# Patient Record
Sex: Female | Born: 1980 | Race: White | Hispanic: No | Smoking: Current every day smoker
Health system: Southern US, Community
[De-identification: ages and names within clinical notes are randomized; demographics above are authoritative.]

## PROBLEM LIST (undated history)

## (undated) DIAGNOSIS — F329 Major depressive disorder, single episode, unspecified: Secondary | ICD-10-CM

## (undated) DIAGNOSIS — G8929 Other chronic pain: Secondary | ICD-10-CM

## (undated) DIAGNOSIS — F32A Depression, unspecified: Secondary | ICD-10-CM

## (undated) DIAGNOSIS — N83209 Unspecified ovarian cyst, unspecified side: Secondary | ICD-10-CM

## (undated) DIAGNOSIS — R102 Pelvic and perineal pain: Secondary | ICD-10-CM

## (undated) HISTORY — PX: TUBAL LIGATION: SHX77

---

## 2011-04-08 ENCOUNTER — Encounter (HOSPITAL_COMMUNITY): Payer: Self-pay | Admitting: *Deleted

## 2011-04-08 ENCOUNTER — Inpatient Hospital Stay (HOSPITAL_COMMUNITY): Payer: Self-pay

## 2011-04-08 ENCOUNTER — Inpatient Hospital Stay (HOSPITAL_COMMUNITY)
Admission: AD | Admit: 2011-04-08 | Discharge: 2011-04-08 | Disposition: A | Discharge status: Home / Self Care | Payer: Self-pay | Source: Ambulatory Visit | Attending: Family Medicine | Admitting: Family Medicine

## 2011-04-08 DIAGNOSIS — N949 Unspecified condition associated with female genital organs and menstrual cycle: Secondary | ICD-10-CM | POA: Insufficient documentation

## 2011-04-08 DIAGNOSIS — R102 Pelvic and perineal pain: Secondary | ICD-10-CM

## 2011-04-08 DIAGNOSIS — A499 Bacterial infection, unspecified: Secondary | ICD-10-CM | POA: Insufficient documentation

## 2011-04-08 DIAGNOSIS — R109 Unspecified abdominal pain: Secondary | ICD-10-CM | POA: Insufficient documentation

## 2011-04-08 DIAGNOSIS — N760 Acute vaginitis: Secondary | ICD-10-CM | POA: Insufficient documentation

## 2011-04-08 DIAGNOSIS — B9689 Other specified bacterial agents as the cause of diseases classified elsewhere: Secondary | ICD-10-CM | POA: Insufficient documentation

## 2011-04-08 HISTORY — DX: Pelvic and perineal pain: R10.2

## 2011-04-08 HISTORY — DX: Unspecified ovarian cyst, unspecified side: N83.209

## 2011-04-08 HISTORY — DX: Other chronic pain: G89.29

## 2011-04-08 LAB — URINALYSIS, ROUTINE W REFLEX MICROSCOPIC
Bilirubin Urine: NEGATIVE
Hgb urine dipstick: NEGATIVE
Ketones, ur: NEGATIVE mg/dL
Specific Gravity, Urine: 1.025 (ref 1.005–1.030)
pH: 6.5 (ref 5.0–8.0)

## 2011-04-08 LAB — DIFFERENTIAL
Eosinophils Absolute: 0.3 10*3/uL (ref 0.0–0.7)
Eosinophils Relative: 3 % (ref 0–5)
Lymphocytes Relative: 27 % (ref 12–46)
Lymphs Abs: 3.2 10*3/uL (ref 0.7–4.0)
Monocytes Absolute: 0.9 10*3/uL (ref 0.1–1.0)
Monocytes Relative: 8 % (ref 3–12)

## 2011-04-08 LAB — CBC
HCT: 43.4 % (ref 36.0–46.0)
MCH: 29.8 pg (ref 26.0–34.0)
MCV: 90.4 fL (ref 78.0–100.0)
RBC: 4.8 MIL/uL (ref 3.87–5.11)
WBC: 11.6 10*3/uL — ABNORMAL HIGH (ref 4.0–10.5)

## 2011-04-08 LAB — WET PREP, GENITAL: Yeast Wet Prep HPF POC: NONE SEEN

## 2011-04-08 MED ORDER — METRONIDAZOLE 500 MG PO TABS: 500.0000 mg | ORAL_TABLET | Freq: Two times a day (BID) | ORAL | Status: AC

## 2011-04-08 NOTE — Progress Notes (Signed)
Lower abdominal pain x 2 years with vaginal odor, was told was BV, history of ovarian cysts.

## 2011-04-08 NOTE — ED Provider Notes (Signed)
History     Chief Complaint  Patient presents with  . Abdominal Pain  . Vaginal Discharge   HPIAdrian Ranson30 y.o. presents with ovarian pain that began this am.  States urinating first thing every am is painful but today it continues.  Reports frequency without hematuria.  Ovarian pain off and on for 2 years.  Has been seen by MD in [place], dx with BV and treated 6 months ago.  Reports foul odor after intercourse "all the time".  G5 P2 1 2 3.  LMP 10 days ago.  BTL.      Past Medical History  Diagnosis Date  . Ovarian cyst   . Chronic pelvic pain in female     Past Surgical History  Procedure Date  . Cesarean section   . Tubal ligation     Family History  Problem Relation Age of Onset  . Cancer Mother   . Kidney disease Mother   . Hypertension Father   . Cancer Maternal Aunt   . Cancer Maternal Grandmother   . Arthritis Paternal Grandfather     History  Substance Use Topics  . Smoking status: Current Everyday Smoker -- 1.5 packs/day for 17 years    Types: Cigarettes  . Smokeless tobacco: Not on file  . Alcohol Use: Yes     occasionally a couple days a week    Allergies: Allergies no known allergies  Prescriptions prior to admission  Medication Sig Dispense Refill  . ferrous fumarate (HEMOCYTE - 106 MG FE) 325 (106 FE) MG TABS Take 1 tablet by mouth daily.        Marland Kitchen HYDROcodone-acetaminophen (NORCO) 10-325 MG per tablet Take 2 tablets by mouth every 6 (six) hours as needed. For pain      . vitamin B-12 (CYANOCOBALAMIN) 500 MCG tablet Take 500 mcg by mouth daily.          Review of Systems  Gastrointestinal: Positive for abdominal pain.  Genitourinary: Positive for dysuria (vaginal odor) and frequency.   Physical Exam   Blood pressure 102/56, pulse 69, temperature 98 F (36.7 C), temperature source Oral, resp. rate 16, height 5\' 10"  (1.778 m), weight 136 lb (61.689 kg), last menstrual period 03/30/2011.  Physical Exam  Constitutional: She is oriented  to person, place, and time. She appears well-developed and well-nourished. No distress.  HENT:  Head: Normocephalic.  Neck: Neck supple.  Respiratory: Effort normal.  GI: Soft. There is no rebound and no guarding.  Genitourinary: Uterus is not enlarged and not tender. Cervix exhibits no motion tenderness, no discharge and no friability. Right adnexum displays tenderness (mild tenderness). Right adnexum displays no mass and no fullness. Left adnexum displays no mass, no tenderness and no fullness. No erythema or bleeding around the vagina. Vaginal discharge (malodorous) found.  Neurological: She is alert and oriented to person, place, and time.  Skin: Skin is warm and dry.    TRANSABDOMINAL AND TRANSVAGINAL ULTRASOUND OF PELVIS  Technique: Both transabdominal and transvaginal ultrasound  examinations of the pelvis were performed. Transabdominal technique  was performed for global imaging of the pelvis including uterus,  ovaries, adnexal regions, and pelvic cul-de-sac.  Comparison: None.  It was necessary to proceed with endovaginal exam following the  transabdominal exam to visualize the uterus and adnexa.  Findings:  Uterus: 10 cm x 4.7 cm x 6.5 cm with normal myometrial  echotexture.  Endometrium: 12 mm, normal.  Right ovary: 35 mm x 35 mm x 27 mm with a dominant follicle  measuring 26 mm.  Left ovary: 26 mm x 18 mm x 25 mm.  Other Findings: No free fluid.  IMPRESSION:  Normal study. No evidence of pelvic mass or other significant  abnormality.  Original Report Authenticated By: Andreas Newport, M.D.      Imaging      MAU Course  Procedures  Gc/Chl culture to lab    Assessment and Plan  A:  Bacterial vaginosis      Pelvic pain  P Rx written for Flagyl  Told her we would call her if her cultures are positive. Discussed at length the lab and ultrasound results with the patient.  Encouraged condom use to decrease risk of re-infection.  If sxs continue, patient agrees to  see her doctor in [place] for followup OR make an appt with a doctor in [place] 04/08/2011, 6:04 PM   Matt Holmes, NP 04/08/11 1926

## 2011-04-08 NOTE — Progress Notes (Signed)
Pt c/o ovarian pain and a foul smell that occurs at least weekly for the past two years.

## 2011-04-09 LAB — GC/CHLAMYDIA PROBE AMP, GENITAL
Chlamydia, DNA Probe: NEGATIVE
GC Probe Amp, Genital: NEGATIVE

## 2011-04-09 NOTE — ED Provider Notes (Signed)
Chart reviewed and agree with management and plan.  

## 2012-05-14 ENCOUNTER — Encounter (HOSPITAL_COMMUNITY): Payer: Self-pay | Admitting: Anesthesiology

## 2012-05-14 ENCOUNTER — Emergency Department (HOSPITAL_COMMUNITY)
Admission: EM | Admit: 2012-05-14 | Discharge: 2012-05-14 | Disposition: A | Discharge status: Home / Self Care | Payer: Self-pay | Attending: Emergency Medicine | Admitting: Emergency Medicine

## 2012-05-14 ENCOUNTER — Emergency Department (HOSPITAL_COMMUNITY): Payer: Self-pay | Admitting: Anesthesiology

## 2012-05-14 ENCOUNTER — Encounter (HOSPITAL_COMMUNITY): Payer: Self-pay | Admitting: Emergency Medicine

## 2012-05-14 ENCOUNTER — Encounter (HOSPITAL_COMMUNITY)
Admission: EM | Disposition: A | Discharge status: Home / Self Care | Payer: Self-pay | Source: Home / Self Care | Attending: Emergency Medicine

## 2012-05-14 ENCOUNTER — Other Ambulatory Visit: Payer: Self-pay | Admitting: Orthopedic Surgery

## 2012-05-14 DIAGNOSIS — W260XXA Contact with knife, initial encounter: Secondary | ICD-10-CM | POA: Insufficient documentation

## 2012-05-14 DIAGNOSIS — S61409A Unspecified open wound of unspecified hand, initial encounter: Secondary | ICD-10-CM | POA: Insufficient documentation

## 2012-05-14 DIAGNOSIS — S66829A Laceration of other specified muscles, fascia and tendons at wrist and hand level, unspecified hand, initial encounter: Secondary | ICD-10-CM | POA: Diagnosis present

## 2012-05-14 DIAGNOSIS — Y93G1 Activity, food preparation and clean up: Secondary | ICD-10-CM | POA: Insufficient documentation

## 2012-05-14 DIAGNOSIS — S66909A Unspecified injury of unspecified muscle, fascia and tendon at wrist and hand level, unspecified hand, initial encounter: Secondary | ICD-10-CM | POA: Insufficient documentation

## 2012-05-14 DIAGNOSIS — Y92009 Unspecified place in unspecified non-institutional (private) residence as the place of occurrence of the external cause: Secondary | ICD-10-CM | POA: Insufficient documentation

## 2012-05-14 DIAGNOSIS — F17200 Nicotine dependence, unspecified, uncomplicated: Secondary | ICD-10-CM | POA: Insufficient documentation

## 2012-05-14 DIAGNOSIS — IMO0002 Reserved for concepts with insufficient information to code with codable children: Secondary | ICD-10-CM | POA: Insufficient documentation

## 2012-05-14 SURGERY — NERVE, TENDON AND ARTERY REPAIR
Anesthesia: General | Site: Hand | Laterality: Left | Wound class: Clean

## 2012-05-14 MED ORDER — BUPIVACAINE HCL (PF) 0.25 % IJ SOLN
INTRAMUSCULAR | Status: AC
  Filled 2012-05-14: qty 30

## 2012-05-14 MED ORDER — HYDROMORPHONE HCL PF 1 MG/ML IJ SOLN
1.0000 mg | Freq: Once | INTRAMUSCULAR | Status: AC
  Administered 2012-05-14: 1 mg via INTRAVENOUS
  Filled 2012-05-14: qty 1

## 2012-05-14 MED ORDER — BUPIVACAINE HCL (PF) 0.25 % IJ SOLN
INTRAMUSCULAR | Status: DC | PRN
  Administered 2012-05-14: 10 mL

## 2012-05-14 MED ORDER — OXYCODONE-ACETAMINOPHEN 5-325 MG PO TABS: 1.0000 | ORAL_TABLET | ORAL | Status: DC | PRN

## 2012-05-14 MED ORDER — FENTANYL CITRATE 0.05 MG/ML IJ SOLN
50.0000 ug | Freq: Once | INTRAMUSCULAR | Status: AC
  Administered 2012-05-14: 50 ug via INTRAVENOUS

## 2012-05-14 MED ORDER — MIDAZOLAM HCL 5 MG/5ML IJ SOLN
INTRAMUSCULAR | Status: DC | PRN
  Administered 2012-05-14: 1 mg via INTRAVENOUS

## 2012-05-14 MED ORDER — OXYCODONE-ACETAMINOPHEN 5-325 MG PO TABS
1.0000 | ORAL_TABLET | ORAL | Status: DC | PRN
  Administered 2012-05-14: 1 via ORAL

## 2012-05-14 MED ORDER — PROMETHAZINE HCL 25 MG/ML IJ SOLN
6.2500 mg | INTRAMUSCULAR | Status: DC | PRN
  Administered 2012-05-14: 12.5 mg via INTRAVENOUS

## 2012-05-14 MED ORDER — OXYCODONE-ACETAMINOPHEN 5-325 MG PO TABS
ORAL_TABLET | ORAL | Status: AC
  Filled 2012-05-14: qty 1

## 2012-05-14 MED ORDER — LACTATED RINGERS IV SOLN: INTRAVENOUS | Status: DC

## 2012-05-14 MED ORDER — LIDOCAINE HCL (CARDIAC) 20 MG/ML IV SOLN
INTRAVENOUS | Status: DC | PRN
  Administered 2012-05-14: 50 mg via INTRAVENOUS

## 2012-05-14 MED ORDER — ONDANSETRON HCL 4 MG/2ML IJ SOLN
4.0000 mg | Freq: Once | INTRAMUSCULAR | Status: AC
  Administered 2012-05-14: 4 mg via INTRAVENOUS
  Filled 2012-05-14: qty 2

## 2012-05-14 MED ORDER — MEPERIDINE HCL 25 MG/ML IJ SOLN: 6.2500 mg | INTRAMUSCULAR | Status: DC | PRN

## 2012-05-14 MED ORDER — HYDROMORPHONE HCL PF 1 MG/ML IJ SOLN
INTRAMUSCULAR | Status: AC
  Filled 2012-05-14: qty 1

## 2012-05-14 MED ORDER — FENTANYL CITRATE 0.05 MG/ML IJ SOLN
50.0000 ug | Freq: Once | INTRAMUSCULAR | Status: AC
  Administered 2012-05-14: 50 ug via INTRAVENOUS
  Filled 2012-05-14: qty 2

## 2012-05-14 MED ORDER — CHLORHEXIDINE GLUCONATE 4 % EX LIQD
60.0000 mL | Freq: Once | CUTANEOUS | Status: DC
  Filled 2012-05-14: qty 60

## 2012-05-14 MED ORDER — FENTANYL CITRATE 0.05 MG/ML IJ SOLN
INTRAMUSCULAR | Status: DC | PRN
  Administered 2012-05-14: 50 ug via INTRAVENOUS
  Administered 2012-05-14: 25 ug via INTRAVENOUS
  Administered 2012-05-14 (×2): 50 ug via INTRAVENOUS
  Administered 2012-05-14: 25 ug via INTRAVENOUS
  Administered 2012-05-14: 100 ug via INTRAVENOUS

## 2012-05-14 MED ORDER — HYDROMORPHONE HCL PF 1 MG/ML IJ SOLN
0.2500 mg | INTRAMUSCULAR | Status: DC | PRN
  Administered 2012-05-14 (×6): 0.5 mg via INTRAVENOUS

## 2012-05-14 MED ORDER — CEFAZOLIN SODIUM 1-5 GM-% IV SOLN
INTRAVENOUS | Status: DC | PRN
  Administered 2012-05-14: 1 g via INTRAVENOUS

## 2012-05-14 MED ORDER — PROMETHAZINE HCL 25 MG/ML IJ SOLN
INTRAMUSCULAR | Status: AC
  Filled 2012-05-14: qty 1

## 2012-05-14 MED ORDER — LACTATED RINGERS IV SOLN
INTRAVENOUS | Status: DC | PRN
  Administered 2012-05-14 (×2): via INTRAVENOUS

## 2012-05-14 MED ORDER — LACTATED RINGERS IV SOLN
INTRAVENOUS | Status: DC
  Administered 2012-05-14: 13:00:00 via INTRAVENOUS

## 2012-05-14 MED ORDER — CEFAZOLIN SODIUM 1-5 GM-% IV SOLN
INTRAVENOUS | Status: AC
  Filled 2012-05-14: qty 50

## 2012-05-14 MED ORDER — PROPOFOL 10 MG/ML IV BOLUS
INTRAVENOUS | Status: DC | PRN
  Administered 2012-05-14: 200 mg via INTRAVENOUS

## 2012-05-14 MED ORDER — ONDANSETRON HCL 4 MG/2ML IJ SOLN
INTRAMUSCULAR | Status: DC | PRN
  Administered 2012-05-14: 4 mg via INTRAVENOUS

## 2012-05-14 SURGICAL SUPPLY — 54 items
BANDAGE ELASTIC 4 VELCRO ST LF (GAUZE/BANDAGES/DRESSINGS) IMPLANT
BANDAGE GAUZE ELAST BULKY 4 IN (GAUZE/BANDAGES/DRESSINGS) ×2 IMPLANT
BNDG CMPR 9X4 STRL LF SNTH (GAUZE/BANDAGES/DRESSINGS) ×1
BNDG ESMARK 4X9 LF (GAUZE/BANDAGES/DRESSINGS) ×2 IMPLANT
CLOTH BEACON ORANGE TIMEOUT ST (SAFETY) ×2 IMPLANT
CORDS BIPOLAR (ELECTRODE) ×2 IMPLANT
COVER SURGICAL LIGHT HANDLE (MISCELLANEOUS) ×2 IMPLANT
CUFF TOURNIQUET SINGLE 18IN (TOURNIQUET CUFF) IMPLANT
CUFF TOURNIQUET SINGLE 24IN (TOURNIQUET CUFF) IMPLANT
DECANTER SPIKE VIAL GLASS SM (MISCELLANEOUS) IMPLANT
DRAPE OEC MINIVIEW 54X84 (DRAPES) IMPLANT
DRAPE SURG 17X23 STRL (DRAPES) ×2 IMPLANT
DURAPREP 26ML APPLICATOR (WOUND CARE) ×2 IMPLANT
GAUZE XEROFORM 1X8 LF (GAUZE/BANDAGES/DRESSINGS) ×2 IMPLANT
GLOVE BIO SURGEON STRL SZ8.5 (GLOVE) ×2 IMPLANT
GLOVE BIOGEL PI IND STRL 8 (GLOVE) ×1 IMPLANT
GLOVE BIOGEL PI INDICATOR 8 (GLOVE) ×1
GOWN PREVENTION PLUS XLARGE (GOWN DISPOSABLE) ×2 IMPLANT
GOWN STRL NON-REIN LRG LVL3 (GOWN DISPOSABLE) ×2 IMPLANT
KIT BASIN OR (CUSTOM PROCEDURE TRAY) ×2 IMPLANT
KIT ROOM TURNOVER OR (KITS) ×2 IMPLANT
LOOP VESSEL MAXI BLUE (MISCELLANEOUS) IMPLANT
MANIFOLD NEPTUNE II (INSTRUMENTS) ×2 IMPLANT
NEEDLE HYPO 25GX1X1/2 BEV (NEEDLE) ×2 IMPLANT
NS IRRIG 1000ML POUR BTL (IV SOLUTION) ×2 IMPLANT
PACK ORTHO EXTREMITY (CUSTOM PROCEDURE TRAY) ×2 IMPLANT
PAD ARMBOARD 7.5X6 YLW CONV (MISCELLANEOUS) ×4 IMPLANT
PAD CAST 4YDX4 CTTN HI CHSV (CAST SUPPLIES) IMPLANT
PADDING CAST ABS 4INX4YD NS (CAST SUPPLIES) ×1
PADDING CAST ABS COTTON 4X4 ST (CAST SUPPLIES) ×1 IMPLANT
PADDING CAST COTTON 4X4 STRL (CAST SUPPLIES)
SPEAR EYE SURG WECK-CEL (MISCELLANEOUS) IMPLANT
SPECIMEN JAR SMALL (MISCELLANEOUS) ×2 IMPLANT
SPONGE GAUZE 4X4 12PLY (GAUZE/BANDAGES/DRESSINGS) ×2 IMPLANT
STRIP CLOSURE SKIN 1/2X4 (GAUZE/BANDAGES/DRESSINGS) IMPLANT
SUCTION FRAZIER TIP 10 FR DISP (SUCTIONS) IMPLANT
SUT ETHIBOND 3-0 V-5 (SUTURE) ×4 IMPLANT
SUT ETHILON 4 0 PS 2 18 (SUTURE) ×2 IMPLANT
SUT ETHILON 5 0 PS 2 18 (SUTURE) IMPLANT
SUT ETHILON 9 0 BV130 4 (SUTURE) ×4 IMPLANT
SUT MERSILENE 4 0 P 3 (SUTURE) IMPLANT
SUT PROLENE 3 0 PS 2 (SUTURE) IMPLANT
SUT PROLENE 6 0 PC 1 (SUTURE) ×2 IMPLANT
SUT SILK 4 0 PS 2 (SUTURE) IMPLANT
SUT VIC AB 2-0 SH 27 (SUTURE) ×2
SUT VIC AB 2-0 SH 27XBRD (SUTURE) ×1 IMPLANT
SUT VIC AB 3-0 FS2 27 (SUTURE) IMPLANT
SUT VICRYL RAPIDE 4/0 PS 2 (SUTURE) ×4 IMPLANT
SYR CONTROL 10ML LL (SYRINGE) IMPLANT
TOWEL OR 17X24 6PK STRL BLUE (TOWEL DISPOSABLE) ×2 IMPLANT
TOWEL OR 17X26 10 PK STRL BLUE (TOWEL DISPOSABLE) ×2 IMPLANT
TUBE CONNECTING 12X1/4 (SUCTIONS) IMPLANT
UNDERPAD 30X30 INCONTINENT (UNDERPADS AND DIAPERS) ×2 IMPLANT
WATER STERILE IRR 1000ML POUR (IV SOLUTION) ×2 IMPLANT

## 2012-05-14 NOTE — ED Notes (Signed)
Or is ready for pt. 

## 2012-05-14 NOTE — Consult Note (Signed)
Reason for Consult:left thumb lacdration Referring Physician: [name] is an 31 y.o. female.  HPI: s/p deep lac to left thumb with probable T/A/N  Past Medical History  Diagnosis Date  . Ovarian cyst   . Chronic pelvic pain in female     Past Surgical History  Procedure Date  . Cesarean section   . Tubal ligation     Family History  Problem Relation Age of Onset  . Cancer Mother   . Kidney disease Mother   . Hypertension Father   . Cancer Maternal Aunt   . Cancer Maternal Grandmother   . Arthritis Paternal Grandfather     Social History:  reports that she has been smoking Cigarettes.  She has a 25.5 pack-year smoking history. She does not have any smokeless tobacco history on file. She reports that she drinks alcohol. She reports that she does not use illicit drugs.  Allergies: No Known Allergies  Medications: Prior to Admission:  (Not in a hospital admission)  No results found for this or any previous visit (from the past 48 hour(s)).  No results found.  Review of Systems  All other systems reviewed and are negative.   Blood pressure 114/74, pulse 80, temperature 98.1 F (36.7 C), temperature source Oral, resp. rate 18, SpO2 98.00%. Physical Exam  Constitutional: She is oriented to person, place, and time. She appears well-developed and well-nourished.  HENT:  Head: Normocephalic and atraumatic.  Cardiovascular: Normal rate.   Respiratory: Effort normal.  Musculoskeletal:       Hands: Neurological: She is alert and oriented to person, place, and time.  Skin: Skin is warm.  Psychiatric: She has a normal mood and affect. Her behavior is normal. Judgment and thought content normal.    Assessment/Plan: As above   Plan explore and repair as needed  Evelyn Richardson A 05/14/2012, 1:16 PM

## 2012-05-14 NOTE — ED Provider Notes (Signed)
12:14 PM Assumed care of the patient.  She is awaiting surgery of her hand.  Patient is complaining of sig pain. Nursing staff states that she is threatening to leave if pain is not controlled.   BP is stable and I am giving patient more dilaudid.   CV: RRR, No M/R/G, Peripheral pulses intact. No peripheral edema. Lungs: CTAB Abd: Soft, Non tender, non distended Large bandage covering laceration of left hand.  Surgery has already been consulted by PA Albert. Dr. Orlan Leavens will consult and admit.   Arthor Captain, PA-C 05/15/12 1321

## 2012-05-14 NOTE — ED Provider Notes (Signed)
History     CSN: [id number]  Arrival date & time 05/14/12  [id number]   First MD Initiated Contact with Patient 05/14/12 [phone]      Chief Complaint  Patient presents with  . Laceration    (Consider location/radiation/quality/duration/timing/severity/associated sxs/prior treatment) HPI Comments: 31 y/o female presents the emergency department with a laceration below her left thumb after trying to cut an apple with a dull knife earlier this morning. She is complaining of a great amount of pain and states she is bleeding a lot. Denies lightheadedness or dizziness. States her thumb is beginning to get numb. Last tetanus shot was 2 years ago. She's been applying pressure to the area to try to relieve the bleeding.  The history is provided by the patient.    Past Medical History  Diagnosis Date  . Ovarian cyst   . Chronic pelvic pain in female     Past Surgical History  Procedure Date  . Cesarean section   . Tubal ligation     Family History  Problem Relation Age of Onset  . Cancer Mother   . Kidney disease Mother   . Hypertension Father   . Cancer Maternal Aunt   . Cancer Maternal Grandmother   . Arthritis Paternal Grandfather     History  Substance Use Topics  . Smoking status: Current Every Day Smoker -- 1.5 packs/day for 17 years    Types: Cigarettes  . Smokeless tobacco: Not on file  . Alcohol Use: Yes     occasionally a couple days a week    OB History    Grav Para Term Preterm Abortions TAB SAB Ect Mult Living   5 3 2 1 2 2    3       Review of Systems  Musculoskeletal:       Left hand pain  Skin: Positive for wound.  Neurological: Positive for numbness. Negative for light-headedness.  Psychiatric/Behavioral: The patient is nervous/anxious.     Allergies  Review of patient's allergies indicates no known allergies.  Home Medications  No current outpatient prescriptions on file.  BP 153/88  Pulse 102  Temp 98.1 F (36.7 C) (Oral)  Resp 22  SpO2  98%  Physical Exam  Constitutional: She is oriented to person, place, and time. She appears well-developed and well-nourished. No distress.  HENT:  Head: Normocephalic and atraumatic.  Eyes: Conjunctivae normal and EOM are normal.  Neck: Normal range of motion.  Cardiovascular: Regular rhythm and intact distal pulses.  Tachycardia present.   Pulmonary/Chest: Effort normal and breath sounds normal. No respiratory distress.  Musculoskeletal:       Left hand: She exhibits decreased range of motion (disruption of flexor tendon of thumb) and laceration. She exhibits normal capillary refill.  Neurological: She is alert and oriented to person, place, and time.  Skin: Laceration (1 inch over left thenar eminence) noted.  Psychiatric: Her mood appears anxious. Her speech is rapid and/or pressured. She is is hyperactive.    ED Course  Procedures (including critical care time)  Labs Reviewed - No data to display No results found.   1. Flexor tendon laceration of hand with open wound       MDM  31 y/o female with flexor tendon laceration of left hand. Hand surgeon consulted and patient will be having surgery at 2:30 today. She will be moved to the CDU and monitored by Arthor Captain, PA-C until that occurs.         Trevor Mace, PA-C  05/15/12 0607 

## 2012-05-14 NOTE — Preoperative (Signed)
Beta Blockers   Reason not to administer Beta Blockers:Not Applicable 

## 2012-05-14 NOTE — Anesthesia Procedure Notes (Signed)
Procedure Name: LMA Insertion Date/Time: 05/14/2012 1:41 PM Performed by: Carmela Rima Pre-anesthesia Checklist: Patient identified, Timeout performed, Emergency Drugs available, Suction available and Patient being monitored Patient Re-evaluated:Patient Re-evaluated prior to inductionOxygen Delivery Method: Circle system utilized Preoxygenation: Pre-oxygenation with 100% oxygen Intubation Type: IV induction Ventilation: Mask ventilation without difficulty LMA: LMA inserted LMA Size: 4.0 Number of attempts: 1 Placement Confirmation: positive ETCO2 and breath sounds checked- equal and bilateral Tube secured with: Tape Dental Injury: Teeth and Oropharynx as per pre-operative assessment

## 2012-05-14 NOTE — Op Note (Signed)
See dictated note [phone]

## 2012-05-14 NOTE — Anesthesia Preprocedure Evaluation (Addendum)
Anesthesia Evaluation  Patient identified by MRN, date of birth, ID band Patient awake    Reviewed: Allergy & Precautions, H&P , NPO status , Patient's Chart, lab work & pertinent test results  Airway Mallampati: II TM Distance: >3 FB Neck ROM: Full    Dental No notable dental hx.    Pulmonary neg pulmonary ROS, Current Smoker,  breath sounds clear to auscultation  Pulmonary exam normal       Cardiovascular negative cardio ROS  Rhythm:Regular Rate:Normal     Neuro/Psych negative neurological ROS  negative psych ROS   GI/Hepatic negative GI ROS, Neg liver ROS,   Endo/Other  negative endocrine ROS  Renal/GU negative Renal ROS  negative genitourinary   Musculoskeletal negative musculoskeletal ROS (+)   Abdominal   Peds negative pediatric ROS (+)  Hematology negative hematology ROS (+)   Anesthesia Other Findings   Reproductive/Obstetrics negative OB ROS                          Anesthesia Physical Anesthesia Plan  ASA: II  Anesthesia Plan: General   Post-op Pain Management:    Induction: Intravenous  Airway Management Planned: LMA  Additional Equipment:   Intra-op Plan:   Post-operative Plan: Extubation in OR  Informed Consent: I have reviewed the patients History and Physical, chart, labs and discussed the procedure including the risks, benefits and alternatives for the proposed anesthesia with the patient or authorized representative who has indicated his/her understanding and acceptance.   Dental advisory given and Dental Advisory Given  Plan Discussed with: CRNA, Anesthesiologist and Surgeon  Anesthesia Plan Comments:        Anesthesia Quick Evaluation

## 2012-05-14 NOTE — Transfer of Care (Signed)
Immediate Anesthesia Transfer of Care Note  Patient: Evelyn Richardson  Procedure(s) Performed: Procedure(s) (LRB) with comments: NERVE, TENDON AND ARTERY REPAIR (Left)  Patient Location: PACU  Anesthesia Type: General  Level of Consciousness: awake, alert  and oriented  Airway & Oxygen Therapy: Patient Spontanous Breathing and Patient connected to nasal cannula oxygen  Post-op Assessment: Report given to PACU RN, Post -op Vital signs reviewed and stable and Patient moving all extremities  Post vital signs: Reviewed and stable  Complications: No apparent anesthesia complications

## 2012-05-14 NOTE — Progress Notes (Signed)
Call to dr. Mina Marble because pt wanted stronger medicine as a home presription.  He denied the request.  Also, spoke with dr. Randa Evens who gave permission for one roxicet 5/325. Pt had had 3 mg dilauded at this point and still complained of 10/10 pain.  Dr. Randa Evens aware and no further orders given.  Pt discharged and encouraged to fill prescription promply and take as prescribed.

## 2012-05-14 NOTE — ED Notes (Signed)
Patient is extremely upset and threating to go AMA. Pain medication was given and dressing was redone for the bleeding. Patient is now calm and being moved to CDU.

## 2012-05-14 NOTE — Brief Op Note (Signed)
05/14/2012  3:08 PM  PATIENT:  Evelyn Richardson  31 y.o. female  PRE-OPERATIVE DIAGNOSIS:  left thumb laceration  POST-OPERATIVE DIAGNOSIS:  left thumb laceration  PROCEDURE:  Procedure(s) (LRB) with comments: NERVE, TENDON AND ARTERY REPAIR (Left)  SURGEON:  Surgeon(s) and Role:    * Marlowe Shores, MD - Primary  PHYSICIAN ASSISTANT:   ASSISTANTS: none   ANESTHESIA:   general  EBL:  Total I/O In: 1200 [I.V.:1200] Out: 10 [Blood:10]  BLOOD ADMINISTERED:none  DRAINS: none   LOCAL MEDICATIONS USED:  MARCAINE   10cc  SPECIMEN:  No Specimen  DISPOSITION OF SPECIMEN:  N/A  COUNTS:  YES  TOURNIQUET:   Total Tourniquet Time Documented: Upper Arm (Left) - 71 minutes  DICTATION: .Other Dictation: Dictation Number [id number]  PLAN OF CARE: Discharge to home after PACU  PATIENT DISPOSITION:  PACU - hemodynamically stable.   Delay start of Pharmacological VTE agent (>24hrs) due to surgical blood loss or risk of bleeding: not applicable

## 2012-05-14 NOTE — ED Notes (Signed)
Report called to zach in anesthesia

## 2012-05-14 NOTE — Anesthesia Postprocedure Evaluation (Signed)
  Anesthesia Post-op Note  Patient: Evelyn Richardson  Procedure(s) Performed: Procedure(s) (LRB): NERVE, TENDON AND ARTERY REPAIR (Left)  Patient Location: PACU  Anesthesia Type: General  Level of Consciousness: awake and alert   Airway and Oxygen Therapy: Patient Spontanous Breathing  Post-op Pain: mild  Post-op Assessment: Post-op Vital signs reviewed, Patient's Cardiovascular Status Stable, Respiratory Function Stable, Patent Airway and No signs of Nausea or vomiting  Post-op Vital Signs: stable  Complications: No apparent anesthesia complications

## 2012-05-14 NOTE — ED Provider Notes (Signed)
Patient lacerated left thenar eminence one hour ago while slicing an apple with a knife.. She complains of pain at left thenar eminence. On exam patient is alert nontoxic left hand with laceration at left thenar eminence she is unable to flex or oppose from. She is able to extend the thumb with pain. No active bleeding. Good capillary refill Suspect tendon injury Hand surgery call to evaluate patient  Doug Sou, MD 05/14/12 [phone]

## 2012-05-14 NOTE — ED Notes (Addendum)
Pt presents with 1-2 inch lac to left thumb, bleeding controlled on arrival, no other injuries, sts last tetnus 2 years ago, NAD

## 2012-05-15 NOTE — Op Note (Signed)
[name], TAPE               ACCOUNT NO.:  [id number]  MEDICAL RECORD NO.:  [id number]  LOCATION:  [facility]                         FACILITY:  [facility]  PHYSICIAN:  Artist Pais. Tomi Paddock, M.D.DATE OF BIRTH:  1980-11-28  DATE OF PROCEDURE:  05/14/2012 DATE OF DISCHARGE:  05/14/2012                              OPERATIVE REPORT   PREOPERATIVE DIAGNOSIS:  Deep laceration, palmar aspect and thenar area, left thumb.  POSTOPERATIVE DIAGNOSIS:  Deep laceration, palmar aspect and thenar area, left thumb.  PROCEDURE:  Exploration, repair of FDL tendon and radial digital nerve microscopically as well as repair of thenar muscle.  SURGEON:  Artist Pais. Mina Marble, M.D.  ASSISTANT:  None.  ANESTHESIA:  General.  COMPLICATIONS:  No complication.  DRAINS:  No drains.  The patient was taken the operating suite.  After induction of adequate general anesthesia, left upper extremity was prepped and draped in sterile fashion.  An Esmarch was used to exsanguinate the limb. Tourniquet inflated to 250 mmHg.  At this point in time, an oblique laceration across the mid thenar area was extended proximally and distally in [place] fashion.  Dissection was carried down to the flexor sheath.  The FDL was completed lacerated.  The distal aspect was easily identified with flexion of thumb.  There was an intact ulnar neurovascular bundle, radial neurovascular bundle including the digital nerve lacerated.  A counter incision was made in the carpal canal area, the tendon was retrieved.  It was sutured with a 3-0 Ethibond in a Tajima type suture and this was then passed to the carpal canal out to the regional wound.  The distal stump was also sutured with 3-0 Ethibond in the same style and the repair was undertaken, 3-0 Ethibond was used as a core stitch in a horizontal mattress fashion, 5-0 and 6-0 Prolene cutaneous repairs stitch at the end.  Once this was completely repaired, the wound was irrigated.  The  microscope was brought on the field. Under microscopic magnification, the radial digital nerve was repaired with 9- 0 nylon x4 sutures.  The thenar muscle was repaired with 2-0 Vicryl and the skin with 4-0 Vicryl Rapide.  Xeroform, 4x4s, and a radial gutter splint was applied.  The patient tolerated the procedure well and went to the recovery room in stable fashion.     Artist Pais Mina Marble, M.D.     MAW/MEDQ  D:  05/14/2012  T:  05/15/2012  Job:  [id number]

## 2012-05-16 NOTE — ED Provider Notes (Signed)
Medical screening examination/treatment/procedure(s) were conducted as a shared visit with non-physician practitioner(s) and myself.  I personally evaluated the patient during the encounter  Jhett Fretwell, MD 05/16/12 2054 

## 2012-05-16 NOTE — ED Provider Notes (Signed)
Medical screening examination/treatment/procedure(s) were conducted as a shared visit with non-physician practitioner(s) and myself.  I personally evaluated the patient during the encounter  Doug Sou, MD 05/16/12 2054

## 2012-05-20 ENCOUNTER — Ambulatory Visit: Payer: Self-pay | Attending: Orthopedic Surgery | Admitting: Occupational Therapy

## 2012-05-20 DIAGNOSIS — IMO0001 Reserved for inherently not codable concepts without codable children: Secondary | ICD-10-CM | POA: Insufficient documentation

## 2012-05-20 DIAGNOSIS — M25549 Pain in joints of unspecified hand: Secondary | ICD-10-CM | POA: Insufficient documentation

## 2012-05-27 ENCOUNTER — Ambulatory Visit: Payer: Self-pay | Attending: Orthopedic Surgery | Admitting: Occupational Therapy

## 2012-05-27 DIAGNOSIS — M25549 Pain in joints of unspecified hand: Secondary | ICD-10-CM | POA: Insufficient documentation

## 2012-05-27 DIAGNOSIS — IMO0001 Reserved for inherently not codable concepts without codable children: Secondary | ICD-10-CM | POA: Insufficient documentation

## 2012-06-02 ENCOUNTER — Encounter: Payer: Self-pay | Admitting: Occupational Therapy

## 2012-06-10 ENCOUNTER — Ambulatory Visit: Payer: Self-pay | Admitting: Occupational Therapy

## 2012-11-13 IMAGING — US US TRANSVAGINAL NON-OB
1 series · 14 of 25 positions shown · non-contrast
Comparison: None.

CLINICAL DATA: Pelvic pain.

TRANSABDOMINAL AND TRANSVAGINAL ULTRASOUND OF PELVIS
TECHNIQUE: Both transabdominal and transvaginal ultrasound
examinations of the pelvis were performed. Transabdominal technique
was performed for global imaging of the pelvis including uterus,
ovaries, adnexal regions, and pelvic cul-de-sac.

[Series 1: us pelvis complete · 14 of 60 slices shown]
[im 1/60]
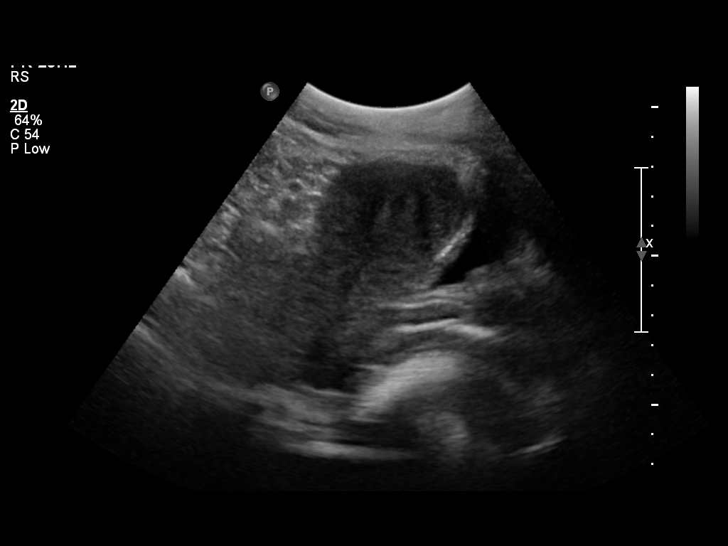
[im 5/60]
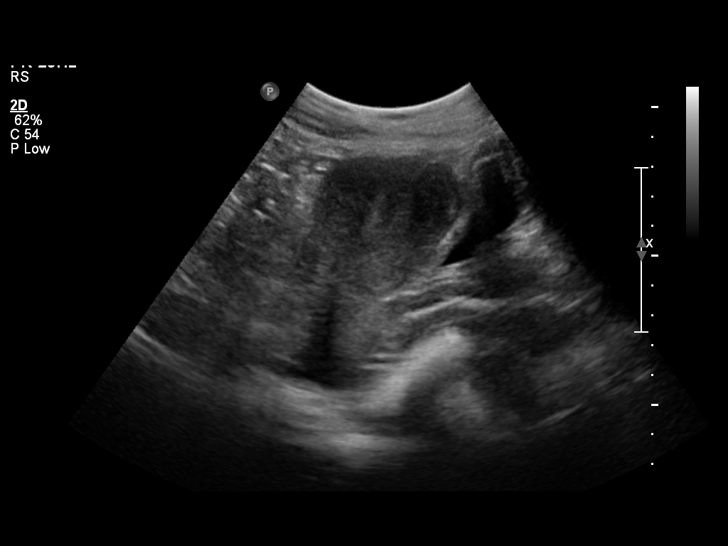
[im 10/60]
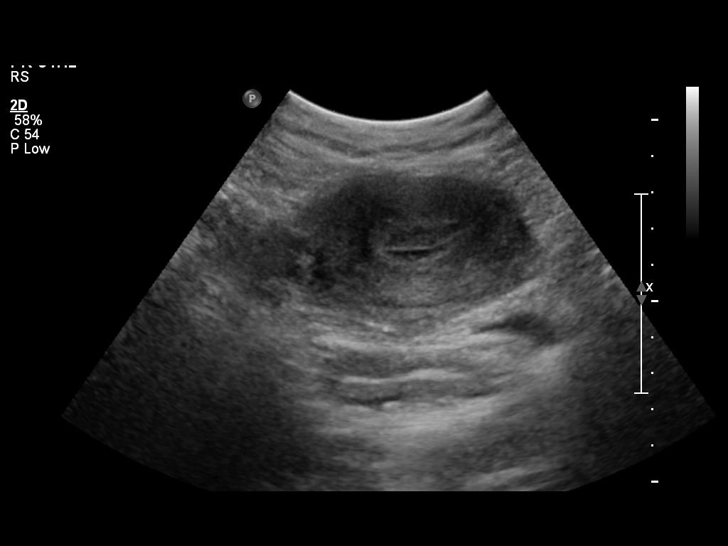
[im 15/60]
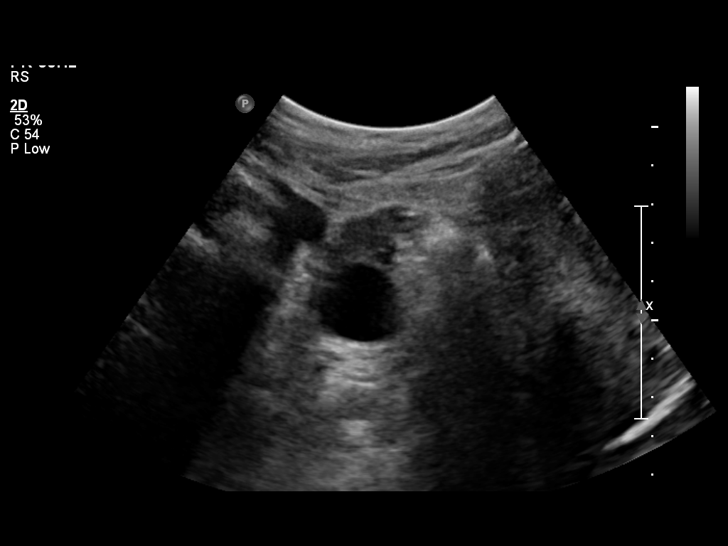
[im 20/60]
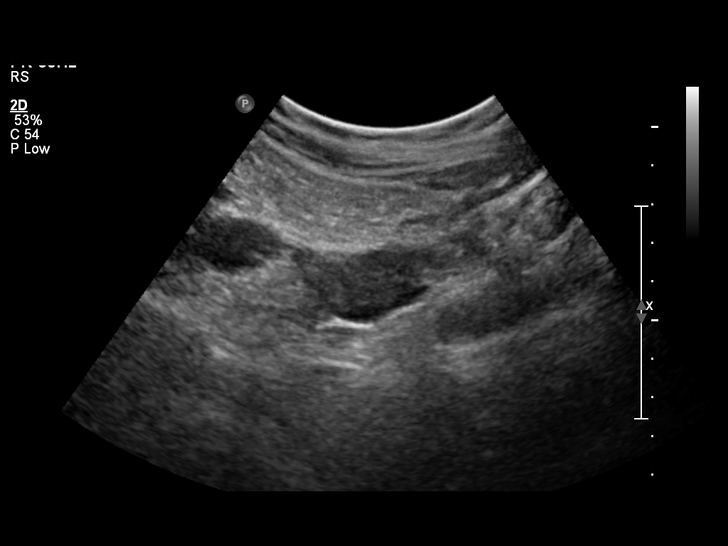
[im 23/60]
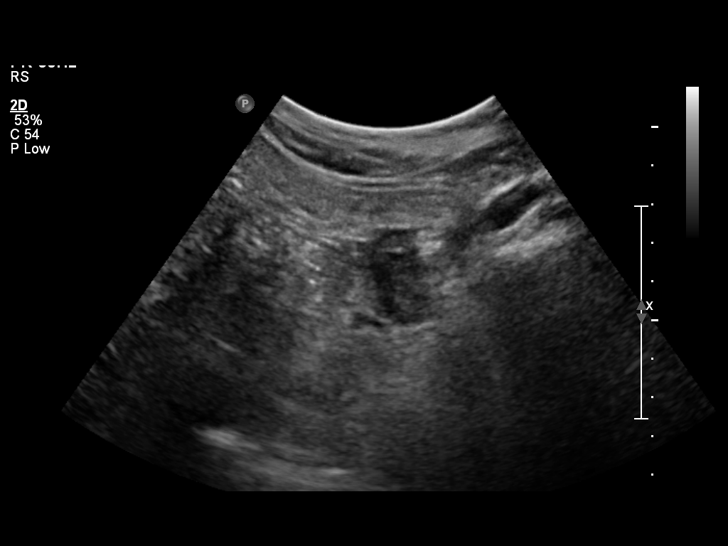
[im 28/60]
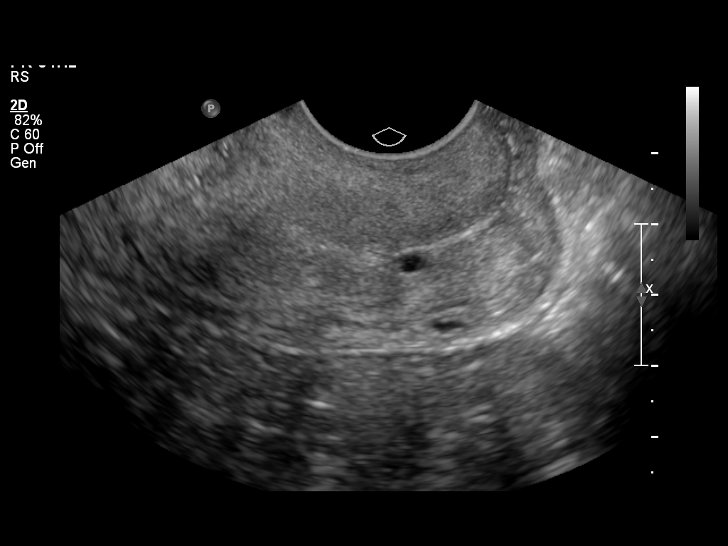
[im 32/60]
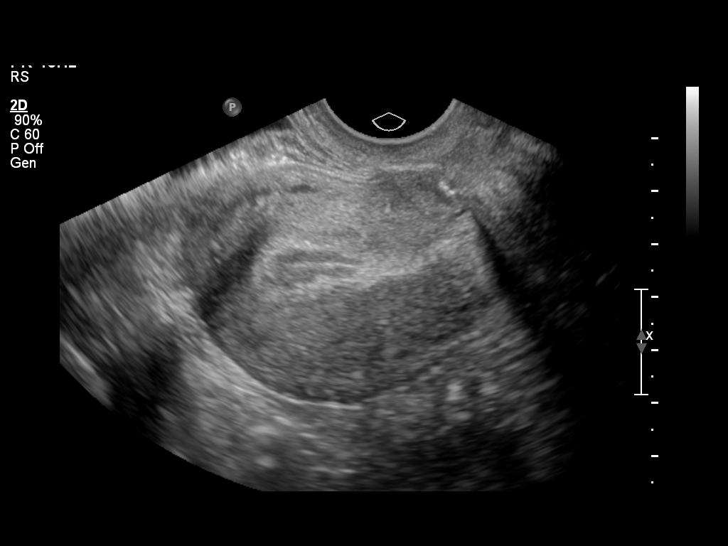
[im 37/60]
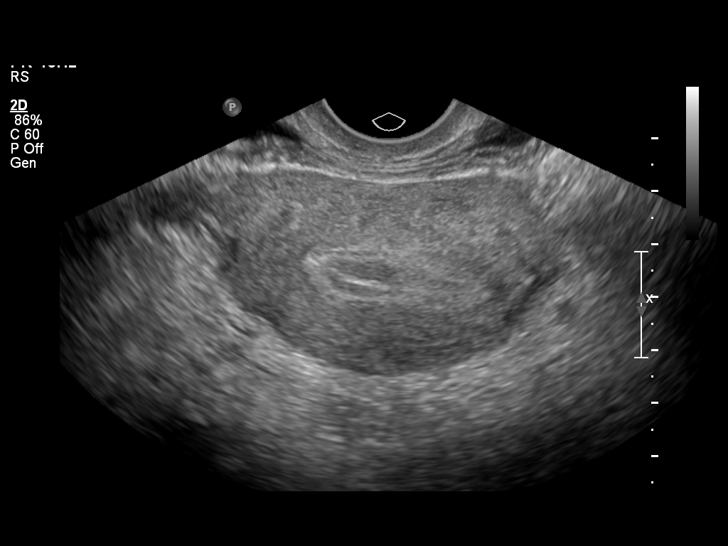
[im 40/60]
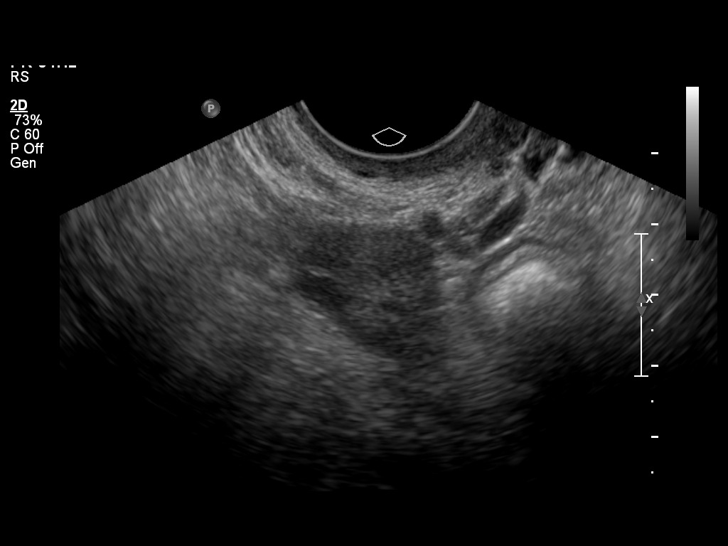
[im 45/60]
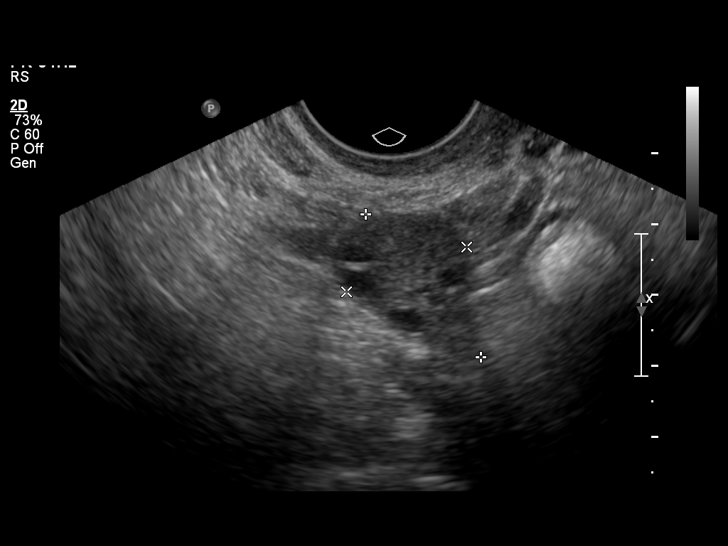
[im 50/60]
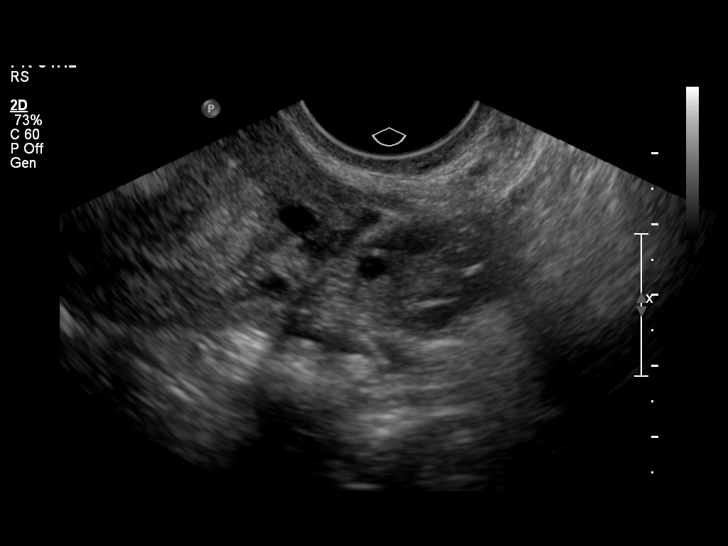
[im 55/60]
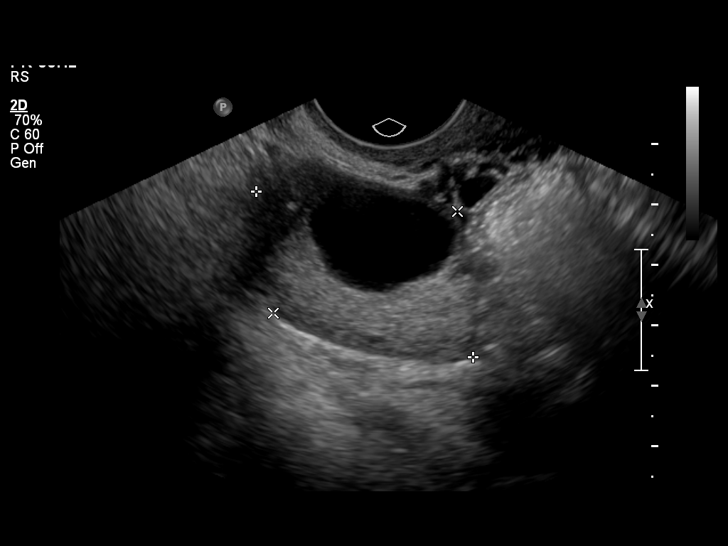
[im 60/60]
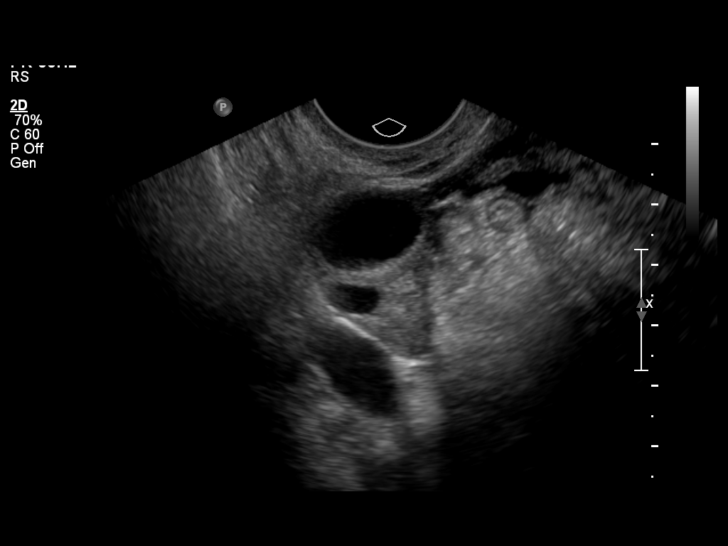

[14 of 25 positions shown; findings below may reference images not displayed]

It was necessary to proceed with endovaginal exam following the
transabdominal exam to visualize the uterus and adnexa.
FINDINGS: Uterus:  10 cm x 4.7 cm x 6.5 cm with normal myometrial
echotexture.

Endometrium: 12 mm, normal.

Right ovary: 35 mm x 35 mm x 27 mm with a dominant follicle
measuring 26 mm.

Left ovary: 26 mm x 18 mm x 25 mm.

Other Findings:  No free fluid.
IMPRESSION: Normal study. No evidence of pelvic mass or other significant
abnormality.

## 2012-12-19 ENCOUNTER — Emergency Department (HOSPITAL_COMMUNITY)
Admission: EM | Admit: 2012-12-19 | Discharge: 2012-12-20 | Disposition: A | Discharge status: Home / Self Care | Payer: Self-pay | Attending: Emergency Medicine | Admitting: Emergency Medicine

## 2012-12-19 ENCOUNTER — Encounter (HOSPITAL_COMMUNITY): Payer: Self-pay | Admitting: Emergency Medicine

## 2012-12-19 DIAGNOSIS — F1910 Other psychoactive substance abuse, uncomplicated: Secondary | ICD-10-CM

## 2012-12-19 DIAGNOSIS — Z8742 Personal history of other diseases of the female genital tract: Secondary | ICD-10-CM | POA: Insufficient documentation

## 2012-12-19 DIAGNOSIS — F1410 Cocaine abuse, uncomplicated: Secondary | ICD-10-CM | POA: Insufficient documentation

## 2012-12-19 DIAGNOSIS — F329 Major depressive disorder, single episode, unspecified: Secondary | ICD-10-CM | POA: Insufficient documentation

## 2012-12-19 DIAGNOSIS — F3289 Other specified depressive episodes: Secondary | ICD-10-CM | POA: Insufficient documentation

## 2012-12-19 DIAGNOSIS — Z3202 Encounter for pregnancy test, result negative: Secondary | ICD-10-CM | POA: Insufficient documentation

## 2012-12-19 DIAGNOSIS — F1110 Opioid abuse, uncomplicated: Secondary | ICD-10-CM | POA: Insufficient documentation

## 2012-12-19 DIAGNOSIS — F17200 Nicotine dependence, unspecified, uncomplicated: Secondary | ICD-10-CM | POA: Insufficient documentation

## 2012-12-19 HISTORY — DX: Major depressive disorder, single episode, unspecified: F32.9

## 2012-12-19 HISTORY — DX: Depression, unspecified: F32.A

## 2012-12-19 LAB — URINE MICROSCOPIC-ADD ON

## 2012-12-19 LAB — COMPREHENSIVE METABOLIC PANEL
ALT: 11 U/L (ref 0–35)
AST: 19 U/L (ref 0–37)
Albumin: 4 g/dL (ref 3.5–5.2)
Alkaline Phosphatase: 84 U/L (ref 39–117)
BUN: 10 mg/dL (ref 6–23)
CO2: 28 mEq/L (ref 19–32)
Calcium: 9.3 mg/dL (ref 8.4–10.5)
Chloride: 99 mEq/L (ref 96–112)
Creatinine, Ser: 0.63 mg/dL (ref 0.50–1.10)
GFR calc Af Amer: >90 mL/min (ref >90)
GFR calc non Af Amer: >90 mL/min (ref >90)
Glucose, Bld: 80 mg/dL (ref 70–99)
Potassium: 4.4 mEq/L (ref 3.5–5.1)
Sodium: 135 mEq/L (ref 135–145)
Total Bilirubin: 0.4 mg/dL (ref 0.3–1.2)
Total Protein: 7.9 g/dL (ref 6.0–8.3)

## 2012-12-19 LAB — CBC WITH DIFFERENTIAL/PLATELET
Basophils Absolute: 0.1 10*3/uL (ref 0.0–0.1)
Basophils Relative: 1 % (ref 0–1)
Eosinophils Absolute: 0.4 10*3/uL (ref 0.0–0.7)
Eosinophils Relative: 4 % (ref 0–5)
HCT: 45.3 % (ref 36.0–46.0)
Hemoglobin: 15.2 g/dL — ABNORMAL HIGH (ref 12.0–15.0)
Lymphocytes Relative: 45 % (ref 12–46)
Lymphs Abs: 4.7 10*3/uL — ABNORMAL HIGH (ref 0.7–4.0)
MCH: 28.7 pg (ref 26.0–34.0)
MCHC: 33.6 g/dL (ref 30.0–36.0)
MCV: 85.5 fL (ref 78.0–100.0)
Monocytes Absolute: 0.8 10*3/uL (ref 0.1–1.0)
Monocytes Relative: 7 % (ref 3–12)
Neutro Abs: 4.6 10*3/uL (ref 1.7–7.7)
Neutrophils Relative %: 43 % (ref 43–77)
Platelets: 313 10*3/uL (ref 150–400)
RBC: 5.3 MIL/uL — ABNORMAL HIGH (ref 3.87–5.11)
RDW: 13.4 % (ref 11.5–15.5)
WBC: 10.5 10*3/uL (ref 4.0–10.5)

## 2012-12-19 LAB — RAPID URINE DRUG SCREEN, HOSP PERFORMED
Amphetamines: NOT DETECTED
Barbiturates: NOT DETECTED
Benzodiazepines: NOT DETECTED
Cocaine: POSITIVE — AB
Opiates: POSITIVE — AB
Tetrahydrocannabinol: NOT DETECTED

## 2012-12-19 LAB — URINALYSIS, ROUTINE W REFLEX MICROSCOPIC
Bilirubin Urine: NEGATIVE
Glucose, UA: NEGATIVE mg/dL
Ketones, ur: NEGATIVE mg/dL
Nitrite: POSITIVE — AB
Protein, ur: NEGATIVE mg/dL
Specific Gravity, Urine: 1.022 (ref 1.005–1.030)
Urobilinogen, UA: 0.2 mg/dL (ref 0.0–1.0)
pH: 5 (ref 5.0–8.0)

## 2012-12-19 LAB — ETHANOL: Alcohol, Ethyl (B): <11 mg/dL (ref 0–11)

## 2012-12-19 LAB — POCT PREGNANCY, URINE: Preg Test, Ur: NEGATIVE

## 2012-12-19 MED ORDER — NAPROXEN 500 MG PO TABS: 500.0000 mg | ORAL_TABLET | Freq: Two times a day (BID) | ORAL | Status: DC | PRN

## 2012-12-19 MED ORDER — ONDANSETRON 4 MG PO TBDP: 4.0000 mg | ORAL_TABLET | Freq: Four times a day (QID) | ORAL | Status: DC | PRN

## 2012-12-19 MED ORDER — CLONIDINE HCL 0.1 MG PO TABS: 0.1000 mg | ORAL_TABLET | Freq: Every day | ORAL | Status: DC

## 2012-12-19 MED ORDER — CLONIDINE HCL 0.1 MG PO TABS: 0.1000 mg | ORAL_TABLET | ORAL | Status: DC

## 2012-12-19 MED ORDER — CLONIDINE HCL 0.1 MG PO TABS: 0.1000 mg | ORAL_TABLET | Freq: Four times a day (QID) | ORAL | Status: DC

## 2012-12-19 MED ORDER — LOPERAMIDE HCL 2 MG PO CAPS: 2.0000 mg | ORAL_CAPSULE | ORAL | Status: DC | PRN

## 2012-12-19 MED ORDER — HYDROXYZINE HCL 25 MG PO TABS
25.0000 mg | ORAL_TABLET | Freq: Four times a day (QID) | ORAL | Status: DC | PRN
  Administered 2012-12-20: 25 mg via ORAL
  Filled 2012-12-19: qty 13
  Filled 2012-12-19: qty 1

## 2012-12-19 MED ORDER — METHOCARBAMOL 500 MG PO TABS: 500.0000 mg | ORAL_TABLET | Freq: Three times a day (TID) | ORAL | Status: DC | PRN

## 2012-12-19 MED ORDER — DICYCLOMINE HCL 20 MG PO TABS: 20.0000 mg | ORAL_TABLET | Freq: Four times a day (QID) | ORAL | Status: DC | PRN

## 2012-12-19 NOTE — ED Notes (Signed)
Pt states that she wants to go to [facility] for [facility] use states that she uses heavy every day for the past year. States last use was today. No shaking, calm ,

## 2012-12-19 NOTE — ED Notes (Signed)
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 Also uses pain pills. Pt. Last used today. States that she wants inpatient treatment. Pt's boyfriend is also in the ED.

## 2012-12-20 ENCOUNTER — Encounter (HOSPITAL_COMMUNITY): Payer: Self-pay | Admitting: *Deleted

## 2012-12-20 MED ORDER — NICOTINE 21 MG/24HR TD PT24
21.0000 mg | MEDICATED_PATCH | Freq: Every day | TRANSDERMAL | Status: DC
  Administered 2012-12-20: 21 mg via TRANSDERMAL
  Filled 2012-12-20: qty 1

## 2012-12-20 MED ORDER — CEPHALEXIN 500 MG PO CAPS
500.0000 mg | ORAL_CAPSULE | Freq: Three times a day (TID) | ORAL | Status: DC
  Administered 2012-12-20: 500 mg via ORAL
  Filled 2012-12-20: qty 1

## 2012-12-20 NOTE — ED Provider Notes (Signed)
Pt does not want detox now  Benny Lennert, MD 12/20/12 1413

## 2012-12-20 NOTE — ED Provider Notes (Signed)
Pt stable awaiting placement  Evelyn Richardson [name], MD 12/20/12 0724 
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
## 2012-12-20 NOTE — BHH Counselor (Signed)
Patient asked to be discharged home after told that she could not go to the same facility with her boyfriend. Patient's boyfriend is Jamey Ripa Mr #[phone]. He has been accepted to [facility] for inpatient treatment. Patient made aware that this is a standard rule for all facilities (no boyfriend/girlfriend's) on the unit together.   Writer offered additional options such as placement at [facility] or [facility]. Pt declined stating, "I don't want to do all that work to get somewhere so just get me discharged home". EDP-Dr. Estell Harpin made aware of patients request to discharge home and agreed to discharge patient from [facility].   Writer provided patient with various referrals to local community substance abuse facilities out-pt and in-pt.

## 2012-12-20 NOTE — ED Notes (Signed)
D/C instructions and resource info given. Escorted by mental health tech to front of building. Belongings returned. Ambulatory without difficulty, no pain voiced.

## 2012-12-21 LAB — URINE CULTURE: Colony Count: >=100000

## 2013-01-05 NOTE — ED Provider Notes (Signed)
History    31yF requesting detox. Abuses heroin and prescription pain medications. Has been using for over a year. Last used today. Wants to quit. No specific impetus for coming today. Denies SI or HI. No hallucinations.   CSN: [id number]  Arrival date & time 12/19/12  1622   First MD Initiated Contact with Patient 12/19/12 1807      Chief Complaint  Patient presents with  . Medical Clearance    (Consider location/radiation/quality/duration/timing/severity/associated sxs/prior treatment) HPI  Past Medical History  Diagnosis Date  . Ovarian cyst   . Chronic pelvic pain in female   . Depression     Past Surgical History  Procedure Laterality Date  . Cesarean section    . Tubal ligation      Family History  Problem Relation Age of Onset  . Cancer Mother   . Kidney disease Mother   . Hypertension Father   . Cancer Maternal Aunt   . Cancer Maternal Grandmother   . Arthritis Paternal Grandfather     History  Substance Use Topics  . Smoking status: Current Every Day Smoker -- 1.50 packs/day for 17 years    Types: Cigarettes  . Smokeless tobacco: Not on file  . Alcohol Use: Yes     Comment: occasionally a couple days a week    OB History   Grav Para Term Preterm Abortions TAB SAB Ect Mult Living   5 3 2 1 2 2    3       Review of Systems  All systems reviewed and negative, other than as noted in HPI.   Allergies  Review of patient's allergies indicates no known allergies.  Home Medications  No current outpatient prescriptions on file.  BP 118/78  Pulse 70  Temp(Src) 98.3 F (36.8 C) (Oral)  Resp 18  SpO2 98%  Physical Exam  Nursing note and vitals reviewed. Constitutional: She appears well-developed and well-nourished. No distress.  HENT:  Head: Normocephalic and atraumatic.  Eyes: Conjunctivae are normal. Right eye exhibits no discharge. Left eye exhibits no discharge.  Neck: Neck supple.  Cardiovascular: Normal rate, regular rhythm and normal  heart sounds.  Exam reveals no gallop and no friction rub.   No murmur heard. Pulmonary/Chest: Effort normal and breath sounds normal. No respiratory distress.  Abdominal: Soft. She exhibits no distension. There is no tenderness.  Musculoskeletal: She exhibits no edema and no tenderness.  Neurological: She is alert.  Skin: Skin is warm and dry.  Psychiatric: She has a normal mood and affect. Her behavior is normal. Thought content normal.    ED Course  Procedures (including critical care time)  Labs Reviewed  CBC WITH DIFFERENTIAL - Abnormal; Notable for the following:    RBC 5.30 (*)    Hemoglobin 15.2 (*)    Lymphs Abs 4.7 (*)    All other components within normal limits  URINALYSIS, ROUTINE W REFLEX MICROSCOPIC - Abnormal; Notable for the following:    APPearance CLOUDY (*)    Hgb urine dipstick TRACE (*)    Nitrite POSITIVE (*)    Leukocytes, UA SMALL (*)    All other components within normal limits  URINE RAPID DRUG SCREEN (HOSP PERFORMED) - Abnormal; Notable for the following:    Opiates POSITIVE (*)    Cocaine POSITIVE (*)    All other components within normal limits  URINE MICROSCOPIC-ADD ON - Abnormal; Notable for the following:    Bacteria, UA MANY (*)    All other components within normal limits  URINE CULTURE  COMPREHENSIVE METABOLIC PANEL  ETHANOL  POCT PREGNANCY, URINE   No results found.   1. Substance abuse       MDM  31yF with substance abuse requesting detox. No SI/HI or psychosis. Will medically clear. ACT eval.         Raeford Razor, MD 01/05/13 [phone]

## 2014-05-24 ENCOUNTER — Encounter (HOSPITAL_COMMUNITY): Payer: Self-pay | Admitting: *Deleted

## 2020-07-28 ENCOUNTER — Encounter (HOSPITAL_COMMUNITY): Payer: Self-pay | Admitting: *Deleted

## 2020-07-28 ENCOUNTER — Other Ambulatory Visit: Payer: Self-pay

## 2020-07-28 ENCOUNTER — Emergency Department (HOSPITAL_COMMUNITY)
Admission: EM | Admit: 2020-07-28 | Discharge: 2020-07-28 | Disposition: A | Discharge status: Home / Self Care | Payer: Self-pay | Attending: Emergency Medicine | Admitting: Emergency Medicine

## 2020-07-28 DIAGNOSIS — N12 Tubulo-interstitial nephritis, not specified as acute or chronic: Secondary | ICD-10-CM | POA: Insufficient documentation

## 2020-07-28 DIAGNOSIS — F17210 Nicotine dependence, cigarettes, uncomplicated: Secondary | ICD-10-CM | POA: Insufficient documentation

## 2020-07-28 DIAGNOSIS — A599 Trichomoniasis, unspecified: Secondary | ICD-10-CM | POA: Insufficient documentation

## 2020-07-28 LAB — CBC
HCT: 37.2 % (ref 36.0–46.0)
Hemoglobin: 11.8 g/dL — ABNORMAL LOW (ref 12.0–15.0)
MCH: 27.4 pg (ref 26.0–34.0)
MCHC: 31.7 g/dL (ref 30.0–36.0)
MCV: 86.3 fL (ref 80.0–100.0)
Platelets: 195 10*3/uL (ref 150–400)
RBC: 4.31 MIL/uL (ref 3.87–5.11)
RDW: 13.2 % (ref 11.5–15.5)
WBC: 6 10*3/uL (ref 4.0–10.5)
nRBC: 0 % (ref 0.0–0.2)

## 2020-07-28 LAB — COMPREHENSIVE METABOLIC PANEL
ALT: 39 U/L (ref 0–44)
AST: 79 U/L — ABNORMAL HIGH (ref 15–41)
Albumin: 3.2 g/dL — ABNORMAL LOW (ref 3.5–5.0)
Alkaline Phosphatase: 61 U/L (ref 38–126)
Anion gap: 10 (ref 5–15)
BUN: 12 mg/dL (ref 6–20)
CO2: 22 mmol/L (ref 22–32)
Calcium: 8.1 mg/dL — ABNORMAL LOW (ref 8.9–10.3)
Chloride: 101 mmol/L (ref 98–111)
Creatinine, Ser: 0.71 mg/dL (ref 0.44–1.00)
GFR, Estimated: >60 mL/min (ref >60)
Glucose, Bld: 82 mg/dL (ref 70–99)
Potassium: 4.2 mmol/L (ref 3.5–5.1)
Sodium: 133 mmol/L — ABNORMAL LOW (ref 135–145)
Total Bilirubin: 1 mg/dL (ref 0.3–1.2)
Total Protein: 6.1 g/dL — ABNORMAL LOW (ref 6.5–8.1)

## 2020-07-28 LAB — URINALYSIS, ROUTINE W REFLEX MICROSCOPIC
Bilirubin Urine: NEGATIVE
Glucose, UA: NEGATIVE mg/dL
Ketones, ur: NEGATIVE mg/dL
Nitrite: POSITIVE — AB
Protein, ur: NEGATIVE mg/dL
Specific Gravity, Urine: 1.014 (ref 1.005–1.030)
pH: 7 (ref 5.0–8.0)

## 2020-07-28 LAB — I-STAT BETA HCG BLOOD, ED (MC, WL, AP ONLY): I-stat hCG, quantitative: <5 m[IU]/mL (ref <5)

## 2020-07-28 LAB — LIPASE, BLOOD: Lipase: 31 U/L (ref 11–51)

## 2020-07-28 MED ORDER — CEPHALEXIN 500 MG PO CAPS: 500.0000 mg | ORAL_CAPSULE | Freq: Four times a day (QID) | ORAL | 0 refills | Status: AC

## 2020-07-28 MED ORDER — OXYCODONE-ACETAMINOPHEN 5-325 MG PO TABS
1.0000 | ORAL_TABLET | Freq: Once | ORAL | Status: AC
  Administered 2020-07-28: 1 via ORAL
  Filled 2020-07-28: qty 1

## 2020-07-28 MED ORDER — METHOCARBAMOL 500 MG PO TABS: 500.0000 mg | ORAL_TABLET | Freq: Two times a day (BID) | ORAL | 0 refills | Status: DC

## 2020-07-28 MED ORDER — CEFTRIAXONE SODIUM 500 MG IJ SOLR
500.0000 mg | Freq: Once | INTRAMUSCULAR | Status: AC
  Administered 2020-07-28: 500 mg via INTRAMUSCULAR
  Filled 2020-07-28: qty 500

## 2020-07-28 MED ORDER — AZITHROMYCIN 250 MG PO TABS
1000.0000 mg | ORAL_TABLET | Freq: Once | ORAL | Status: AC
  Administered 2020-07-28: 1000 mg via ORAL
  Filled 2020-07-28: qty 4

## 2020-07-28 MED ORDER — IBUPROFEN 400 MG PO TABS
600.0000 mg | ORAL_TABLET | Freq: Once | ORAL | Status: AC
  Administered 2020-07-28: 600 mg via ORAL
  Filled 2020-07-28: qty 1

## 2020-07-28 MED ORDER — STERILE WATER FOR INJECTION IJ SOLN
INTRAMUSCULAR | Status: AC
  Filled 2020-07-28: qty 10

## 2020-07-28 MED ORDER — METRONIDAZOLE 500 MG PO TABS
2000.0000 mg | ORAL_TABLET | Freq: Once | ORAL | Status: AC
  Administered 2020-07-28: 2000 mg via ORAL
  Filled 2020-07-28: qty 4

## 2020-07-28 NOTE — Discharge Instructions (Addendum)
I have sent your urine for culture.  If you need to be on a different antibiotic based on the bacteria, you will be called. We will also contact you with the results of your gonorrhea and Chlamydia testing if it is positive. You have been treated for the gonorrhea, chlamydia and trichomonas today. You will need to inform your partners about these STDs. Follow-up with your primary care provider or the 1 listed below. Return to the ER if you start to experience worsening pain, abdominal pain, uncontrollable vomiting, chest pain, shortness of breath.

## 2020-07-28 NOTE — ED Provider Notes (Signed)
[facility] EMERGENCY DEPARTMENT Provider Note   CSN: [id number] Arrival date & time: 07/28/20  1062     History Chief Complaint  Patient presents with  . Back Pain    Evelyn Richardson is a 40 y.o. female with a past medical history of ovarian cysts, chronic pelvic pain presenting to the ED with a chief complaint of back pain.  For the past 2 days has been experiencing left lower back pain that is sharp in nature.  Has not tried medications to help with symptoms.  Does report some dysuria and chills.  Denies any documented fevers, abdominal pain or changes to bowel movement.  Reports associated vomiting x1 yesterday.  Denies any bloody emesis.  Reports history of kidney stones and kidney infection but she does not think this feels similar to her prior kidney stones.  Denies any vaginal discharge, pelvic pain, abnormal vaginal bleeding, chest pain or shortness of breath.  Prior abdominal surgeries include C-section.  HPI     Past Medical History:  Diagnosis Date  . Chronic pelvic pain in female   . Depression   . Ovarian cyst     Patient Active Problem List   Diagnosis Date Noted  . Laceration of flexor tendon of hand 05/14/2012    Past Surgical History:  Procedure Laterality Date  . CESAREAN SECTION    . TUBAL LIGATION       OB History    Gravida  5   Para  3   Term  2   Preterm  1   AB  2   Living  3     SAB      IAB  2   Ectopic      Multiple      Live Births              Family History  Problem Relation Age of Onset  . Cancer Mother   . Kidney disease Mother   . Hypertension Father   . Cancer Maternal Aunt   . Cancer Maternal Grandmother   . Arthritis Paternal Grandfather     Social History   Tobacco Use  . Smoking status: Current Every Day Smoker    Packs/day: 1.50    Years: 17.00    Pack years: 25.50    Types: Cigarettes  . Smokeless tobacco: Never Used  Substance Use Topics  . Alcohol use: Yes    Comment:  occasionally a couple days a week  . Drug use: Yes    Types: "Crack" cocaine, Heroin    Home Medications Prior to Admission medications   Medication Sig Start Date End Date Taking? Authorizing Provider  cephALEXin (KEFLEX) 500 MG capsule Take 1 capsule (500 mg total) by mouth 4 (four) times daily for 10 days. 07/28/20 08/07/20 Yes Elmo Shumard, PA-C  methocarbamol (ROBAXIN) 500 MG tablet Take 1 tablet (500 mg total) by mouth 2 (two) times daily. 07/28/20  Yes Ashtynn Berke, PA-C    Allergies    Patient has no known allergies.  Review of Systems   Review of Systems  Constitutional: Positive for chills. Negative for appetite change and fever.  HENT: Negative for ear pain, rhinorrhea, sneezing and sore throat.   Eyes: Negative for photophobia and visual disturbance.  Respiratory: Negative for cough, chest tightness, shortness of breath and wheezing.   Cardiovascular: Negative for chest pain and palpitations.  Gastrointestinal: Positive for vomiting. Negative for abdominal pain, blood in stool, constipation, diarrhea and nausea.  Genitourinary: Positive for  dysuria and flank pain. Negative for hematuria, urgency, vaginal bleeding, vaginal discharge and vaginal pain.  Musculoskeletal: Positive for back pain. Negative for myalgias.  Skin: Negative for rash.  Neurological: Negative for dizziness, weakness and light-headedness.    Physical Exam Updated Vital Signs BP 127/75 (BP Location: Right Wrist)   Pulse 89   Temp 98.9 F (37.2 C) (Oral)   Resp 18   Ht 5\' 10"  (1.778 m)   Wt 68 kg   LMP 07/28/2020   SpO2 99%   BMI 21.52 kg/m   Physical Exam Vitals and nursing note reviewed.  Constitutional:      General: She is not in acute distress.    Appearance: She is well-developed and well-nourished.  HENT:     Head: Normocephalic and atraumatic.     Nose: Nose normal.  Eyes:     General: No scleral icterus.       Right eye: No discharge.        Left eye: No discharge.      Extraocular Movements: EOM normal.     Conjunctiva/sclera: Conjunctivae normal.  Cardiovascular:     Rate and Rhythm: Normal rate and regular rhythm.     Pulses: Intact distal pulses.     Heart sounds: Normal heart sounds. No murmur heard. No friction rub. No gallop.   Pulmonary:     Effort: Pulmonary effort is normal. No respiratory distress.     Breath sounds: Normal breath sounds.  Abdominal:     General: Bowel sounds are normal. There is no distension.     Palpations: Abdomen is soft.     Tenderness: There is no abdominal tenderness. There is left CVA tenderness. There is no guarding.  Musculoskeletal:        General: No edema. Normal range of motion.     Cervical back: Normal range of motion and neck supple.  Skin:    General: Skin is warm and dry.     Findings: No rash.  Neurological:     Mental Status: She is alert.     Motor: No abnormal muscle tone.     Coordination: Coordination normal.  Psychiatric:        Mood and Affect: Mood and affect normal.     ED Results / Procedures / Treatments   Labs (all labs ordered are listed, but only abnormal results are displayed) Labs Reviewed  COMPREHENSIVE METABOLIC PANEL - Abnormal; Notable for the following components:      Result Value   Sodium 133 (*)    Calcium 8.1 (*)    Total Protein 6.1 (*)    Albumin 3.2 (*)    AST 79 (*)    All other components within normal limits  CBC - Abnormal; Notable for the following components:   Hemoglobin 11.8 (*)    All other components within normal limits  URINALYSIS, ROUTINE W REFLEX MICROSCOPIC - Abnormal; Notable for the following components:   APPearance HAZY (*)    Hgb urine dipstick LARGE (*)    Nitrite POSITIVE (*)    Leukocytes,Ua TRACE (*)    Bacteria, UA MANY (*)    Trichomonas, UA PRESENT (*)    All other components within normal limits  URINE CULTURE  LIPASE, BLOOD  I-STAT BETA HCG BLOOD, ED ([facility], [facility], [facility] ONLY)  GC/CHLAMYDIA PROBE AMP ([place]) NOT AT [facility]     EKG None  Radiology No results found.  Procedures Procedures (including critical care time)  Medications Ordered in ED Medications  oxyCODONE-acetaminophen (PERCOCET/ROXICET) 5-325 MG per tablet 1 tablet (has no administration in time range)  metroNIDAZOLE (FLAGYL) tablet 2,000 mg (has no administration in time range)  azithromycin (ZITHROMAX) tablet 1,000 mg (has no administration in time range)  cefTRIAXone (ROCEPHIN) injection 500 mg (has no administration in time range)  ibuprofen (ADVIL) tablet 600 mg (has no administration in time range)    ED Course  I have reviewed the triage vital signs and the nursing notes.  Pertinent labs & imaging results that were available during my care of the patient were reviewed by me and considered in my medical decision making (see chart for details).  Clinical Course as of 07/28/20 1026  Thu Jul 28, 2020  1017 Nitrite(!): POSITIVE [HK]  1017 Leukocytes,Ua(!): TRACE [HK]  1017 Bacteria, UA(!): MANY [HK]  1017 Trichomonas, UA(!): PRESENT [HK]  1017 Hgb urine dipstick(!): LARGE Suspect 2/2 menstrual cycle. [HK]    Clinical Course User Index [HK] Dietrich Pates, PA-C   MDM Rules/Calculators/A&P                          40 year old female presenting to the ED with a chief complaint of left lower back pain, dysuria, chills and vomiting.  Symptoms began yesterday.  History of kidney infection and kidney stone.  Denies any pelvic complaints.  On exam there is left CVA tenderness.  Abdomen is soft.  She is afebrile without recent use of antipyretics.  Lab work here with unremarkable CMP, CBC with no leukocytosis.  hCG is negative.  Urinalysis with positive nitrites, leukocytes and many bacteria.  Incidentally also positive for trichomonas.  Patient informed of this but she was unaware that she would have been exposed to an STD.  She is requesting GC chlamydia testing and treatment at this time as well.  Unfortunately not in an appropriate  setting for a pelvic exam but I feel that since she is not having any pelvic complaints that this can be deferred.  We will add on GC chlamydia probe to the urine.  In the meantime we will treat for pyelonephritis, GC chlamydia and trichomonas.  She will be treated with medication here and sent home with remainder of antibiotic course.  Pain controlled here, and she was told to follow-up with the results of her lab work when it is available.  I have sent her urine for culture.  Return precautions given.   Patient is hemodynamically stable, in NAD, and able to ambulate in the ED. Evaluation does not show pathology that would require ongoing emergent intervention or inpatient treatment. I explained the diagnosis to the patient. Pain has been managed and has no complaints prior to discharge. Patient is comfortable with above plan and is stable for discharge at this time. All questions were answered prior to disposition. Strict return precautions for returning to the ED were discussed. Encouraged follow up with PCP.   An After Visit Summary was printed and given to the patient.   Portions of this note were generated with Scientist, clinical (histocompatibility and immunogenetics). Dictation errors may occur despite best attempts at proofreading.  Final Clinical Impression(s) / ED Diagnoses Final diagnoses:  Pyelonephritis  Trichomonas infection    Rx / DC Orders ED Discharge Orders         Ordered    cephALEXin (KEFLEX) 500 MG capsule  4 times daily        07/28/20 1024    methocarbamol (ROBAXIN) 500 MG tablet  2 times daily  07/28/20 1024           Dietrich Pates, PA-C 07/28/20 1026    Maia Plan, MD 07/29/20 [phone]

## 2020-07-28 NOTE — ED Triage Notes (Signed)
The pt is c/o lower back pain for 12-24 hours no urinary frequency no bloody urine.  Hx of kidney stones but this pain does not feel like kidney stone pain  lmp now

## 2020-07-29 LAB — GC/CHLAMYDIA PROBE AMP (~~LOC~~) NOT AT ARMC
Chlamydia: NEGATIVE
Comment: NEGATIVE
Comment: NORMAL
Neisseria Gonorrhea: NEGATIVE

## 2020-07-30 LAB — URINE CULTURE: Culture: >=100000 — AB

## 2021-08-28 ENCOUNTER — Other Ambulatory Visit: Payer: Self-pay

## 2021-08-28 ENCOUNTER — Encounter (HOSPITAL_BASED_OUTPATIENT_CLINIC_OR_DEPARTMENT_OTHER): Payer: Self-pay | Admitting: *Deleted

## 2021-08-28 ENCOUNTER — Emergency Department (HOSPITAL_BASED_OUTPATIENT_CLINIC_OR_DEPARTMENT_OTHER)
Admission: EM | Admit: 2021-08-28 | Discharge: 2021-08-28 | Disposition: A | Discharge status: Home / Self Care | Payer: Self-pay | Attending: Emergency Medicine | Admitting: Emergency Medicine

## 2021-08-28 DIAGNOSIS — K0889 Other specified disorders of teeth and supporting structures: Secondary | ICD-10-CM

## 2021-08-28 DIAGNOSIS — N898 Other specified noninflammatory disorders of vagina: Secondary | ICD-10-CM

## 2021-08-28 DIAGNOSIS — N3000 Acute cystitis without hematuria: Secondary | ICD-10-CM

## 2021-08-28 LAB — URINALYSIS, ROUTINE W REFLEX MICROSCOPIC
Bilirubin Urine: NEGATIVE
Glucose, UA: NEGATIVE mg/dL
Ketones, ur: NEGATIVE mg/dL
Nitrite: POSITIVE — AB
Protein, ur: 30 mg/dL — AB
Specific Gravity, Urine: 1.029 (ref 1.005–1.030)
WBC, UA: >50 WBC/hpf — ABNORMAL HIGH (ref 0–5)
pH: 5.5 (ref 5.0–8.0)

## 2021-08-28 LAB — PREGNANCY, URINE: Preg Test, Ur: NEGATIVE

## 2021-08-28 MED ORDER — PENICILLIN V POTASSIUM 250 MG PO TABS
500.0000 mg | ORAL_TABLET | Freq: Once | ORAL | Status: AC
Start: 2021-08-28 — End: 2021-08-28
  Administered 2021-08-28: 500 mg via ORAL
  Filled 2021-08-28: qty 2

## 2021-08-28 MED ORDER — IBUPROFEN 600 MG PO TABS: 600.0000 mg | ORAL_TABLET | Freq: Four times a day (QID) | ORAL | 0 refills | Status: DC | PRN

## 2021-08-28 MED ORDER — CEFTRIAXONE SODIUM 1 G IJ SOLR
1.0000 g | Freq: Once | INTRAMUSCULAR | Status: AC
  Administered 2021-08-28: 1 g via INTRAMUSCULAR
  Filled 2021-08-28: qty 10

## 2021-08-28 MED ORDER — KETOROLAC TROMETHAMINE 30 MG/ML IJ SOLN
30.0000 mg | Freq: Once | INTRAMUSCULAR | Status: AC
Start: 2021-08-28 — End: 2021-08-28
  Administered 2021-08-28: 30 mg via INTRAMUSCULAR
  Filled 2021-08-28: qty 1

## 2021-08-28 MED ORDER — LIDOCAINE HCL 1 % IJ SOLN
INTRAMUSCULAR | Status: AC
  Administered 2021-08-28: 20 mL
  Filled 2021-08-28: qty 20

## 2021-08-28 MED ORDER — CEPHALEXIN 500 MG PO CAPS: 500.0000 mg | ORAL_CAPSULE | Freq: Three times a day (TID) | ORAL | 0 refills | Status: DC

## 2021-08-28 MED ORDER — CEFTRIAXONE SODIUM 500 MG IJ SOLR: 500.0000 mg | Freq: Once | INTRAMUSCULAR | Status: DC

## 2021-08-28 NOTE — Discharge Instructions (Signed)
You were seen today for multiple complaints.  You do have evidence of UTI.  Take antibiotics as prescribed.  This will also cover for any underlying dental infection.  You need to follow-up with a dentist regarding your ongoing tooth pain.  Take ibuprofen 600 mg for pain.  Regarding her vaginal discharge, STD testing was sent.  If you are having irregular bleeding or abnormal discharge, you should follow-up at [facility] clinic for full exam.

## 2021-08-28 NOTE — ED Provider Notes (Signed)
[facility]ERG[facility] DEPT Provider Note   CSN: [id number] Arrival date & time: 08/28/21  0350     History  Chief Complaint  Patient presents with   Other    Multiple complaints     Evelyn Richardson is a 41 y.o. female.  HPI     This is a 41 year old female who presents with multiple complaints.  Patient reports that she has had ongoing and worsening dental pain for "many years."  However her pain now radiates from the left lower jaw into her left ear.  She not had any fevers.  No difficulty swallowing.  She does not currently have a dentist.  No recent dental procedures.  She states she has taken multiple over-the-counter medications with minimal relief.  Additionally she states she has noted some dysuria.  She has a history of UTIs.  Denies fevers or back pain.  No abdominal pain, nausea, vomiting.  She does report some vaginal discharge.  She does not believe herself to be pregnant although she does endorse some recent irregular vaginal bleeding.  Home Medications Prior to Admission medications   Medication Sig Start Date End Date Taking? Authorizing Provider  cephALEXin (KEFLEX) 500 MG capsule Take 1 capsule (500 mg total) by mouth 3 (three) times daily. 08/28/21  Yes Damarys Speir, Barbette Hair, MD  ibuprofen (ADVIL) 600 MG tablet Take 1 tablet (600 mg total) by mouth every 6 (six) hours as needed. 08/28/21  Yes Willy Pinkerton, Barbette Hair, MD      Allergies    Patient has no known allergies.    Review of Systems   Review of Systems  Constitutional:  Negative for fever.  HENT:  Positive for dental problem.   Genitourinary:  Positive for dysuria and vaginal discharge.  All other systems reviewed and are negative.  Physical Exam Updated Vital Signs BP 132/85    Pulse 78    Temp 98.3 F (36.8 C)    Resp 16    Ht 1.791 m (5' 10.5")    Wt 74.8 kg    LMP 08/15/2021 (Approximate)    SpO2 96%    BMI 23.34 kg/m  Physical Exam Vitals and nursing note reviewed.  Constitutional:       Appearance: She is well-developed. She is not ill-appearing.  HENT:     Head: Normocephalic and atraumatic.     Left Ear: Tympanic membrane normal.     Nose: Nose normal.     Mouth/Throat:     Mouth: Mucous membranes are moist.     Comments: Generally poor dentition with multiple cracked teeth, tenderness to palpation over the left lower gumline without palpable abscess, no trismus, no fullness under the tongue Eyes:     Pupils: Pupils are equal, round, and reactive to light.  Cardiovascular:     Rate and Rhythm: Normal rate and regular rhythm.     Heart sounds: Normal heart sounds.  Pulmonary:     Effort: Pulmonary effort is normal. No respiratory distress.     Breath sounds: No wheezing.  Abdominal:     Palpations: Abdomen is soft.     Tenderness: There is no right CVA tenderness or left CVA tenderness.  Musculoskeletal:     Cervical back: Neck supple.  Skin:    General: Skin is warm and dry.  Neurological:     Mental Status: She is alert and oriented to person, place, and time.  Psychiatric:        Mood and Affect: Mood normal.    ED  Results / Procedures / Treatments   Labs (all labs ordered are listed, but only abnormal results are displayed) Labs Reviewed  URINALYSIS, ROUTINE W REFLEX MICROSCOPIC - Abnormal; Notable for the following components:      Result Value   APPearance HAZY (*)    Hgb urine dipstick SMALL (*)    Protein, ur 30 (*)    Nitrite POSITIVE (*)    Leukocytes,Ua MODERATE (*)    WBC, UA >50 (*)    Bacteria, UA MANY (*)    All other components within normal limits  URINE CULTURE  PREGNANCY, URINE  GC/CHLAMYDIA PROBE AMP ([place]) NOT AT [facility]    EKG None  Radiology No results found.  Procedures Procedures    Medications Ordered in ED Medications  cefTRIAXone (ROCEPHIN) injection 1 g (has no administration in time range)  ketorolac (TORADOL) 30 MG/ML injection 30 mg (30 mg Intramuscular Given 08/28/21 0416)  penicillin v potassium  (VEETID) tablet 500 mg (500 mg Oral Given 08/28/21 0417)    ED Course/ Medical Decision Making/ A&P                           Medical Decision Making Amount and/or Complexity of Data Reviewed Labs: ordered.  Risk Prescription drug management.   This patient presents to the ED for concern of dental pain, UTI, this involves an extensive number of treatment options, and is a complaint that carries with it a high risk of complications and morbidity.  The differential diagnosis includes dental abscess, deep space infection, UTI, pyelonephritis, STD  MDM:    Is a 41 year old female who presents with multiple complaints.  She is nontoxic and vital signs are reassuring.  She does not have a primary care physician or dentist.  She has generally poor dentition but no drainable abscess.  No evidence of deep space infection and I have low suspicion for Ludwigs.  We will treat for occult dental infection and provide dental resources.  Additionally she reports urinary symptoms.  She is not pregnant.  Urinalysis is consistent with a UTI.  It is nitrate positive with white cells and bacteria.  Will send culture.  Patient was given IM Rocephin.  Given her discharge, GC and Chlamydia testing were sent and are pending.  Patient denies concerns for STD.  I have referred her to [facility] given her recent irregular bleeding.  She is not currently having any abdominal pain.  Do not feel she needs a pelvic examination at this time.  She is not having any bleeding at this time.  She is not pregnant and have low suspicion for ectopic or miscarriage.  Will discharge home on Keflex to cover both dental and urinary sources. (Labs, imaging)  Labs: I Ordered, and personally interpreted labs.  The pertinent results include: Urinalysis nitrite positive with white cells and bacteria  Imaging Studies ordered: I ordered imaging studies including none I independently visualized and interpreted imaging. I agree with the  radiologist interpretation  Additional history obtained from an.  External records from outside source obtained and reviewed including N/A  Critical Interventions: IM Rocephin, Toradol  Consultations: I requested consultation with the NA,  and discussed lab and imaging findings as well as pertinent plan - they recommend: N/A  Cardiac Monitoring: The patient was maintained on a cardiac monitor.  I personally viewed and interpreted the cardiac monitored which showed an underlying rhythm of: Normal sinus rhythm  Reevaluation: After the interventions noted above,  I reevaluated the patient and found that they have :stayed the same   Considered admission for: NA  Social Determinants of Health: No outpatient follow-up  Disposition: Discharge  Co morbidities that complicate the patient evaluation  Past Medical History:  Diagnosis Date   Chronic pelvic pain in female    Depression    Ovarian cyst      Medicines Meds ordered this encounter  Medications   ketorolac (TORADOL) 30 MG/ML injection 30 mg   penicillin v potassium (VEETID) tablet 500 mg   DISCONTD: cefTRIAXone (ROCEPHIN) injection 500 mg    Order Specific Question:   Antibiotic Indication:    Answer:   UTI   cefTRIAXone (ROCEPHIN) injection 1 g    Order Specific Question:   Antibiotic Indication:    Answer:   UTI   cephALEXin (KEFLEX) 500 MG capsule    Sig: Take 1 capsule (500 mg total) by mouth 3 (three) times daily.    Dispense:  21 capsule    Refill:  0   ibuprofen (ADVIL) 600 MG tablet    Sig: Take 1 tablet (600 mg total) by mouth every 6 (six) hours as needed.    Dispense:  30 tablet    Refill:  0   lidocaine (XYLOCAINE) 1 % (with pres) injection    Santiago Bumpers R: cabinet override    I have reviewed the patients home medicines and have made adjustments as needed  Problem List / ED Course: Problem List Items Addressed This Visit   None Visit Diagnoses     Pain, dental    -  Primary   Acute  cystitis without hematuria       Vaginal discharge                       Final Clinical Impression(s) / ED Diagnoses Final diagnoses:  Pain, dental  Acute cystitis without hematuria  Vaginal discharge    Rx / DC Orders ED Discharge Orders          Ordered    cephALEXin (KEFLEX) 500 MG capsule  3 times daily        08/28/21 0433    ibuprofen (ADVIL) 600 MG tablet  Every 6 hours PRN        08/28/21 0433              Merryl Hacker, MD 08/28/21 [id number]

## 2021-08-28 NOTE — ED Notes (Signed)
Lab notified to GC/Chlamydia add on

## 2021-08-28 NOTE — ED Triage Notes (Addendum)
C/o left ear, left upper/lower jaw/tooth pain that started a couple months ago for tooth and yesterday for her ear complaint. Denies any fevers. Has been taking advil and tylenol. Pt also c/o uti symptoms and is having pain after she urinates.

## 2021-08-29 LAB — GC/CHLAMYDIA PROBE AMP (~~LOC~~) NOT AT ARMC
Chlamydia: POSITIVE — AB
Comment: NEGATIVE
Comment: NORMAL
Neisseria Gonorrhea: POSITIVE — AB

## 2021-08-30 LAB — URINE CULTURE: Culture: >=100000 — AB

## 2021-08-31 ENCOUNTER — Telehealth: Payer: Self-pay | Admitting: *Deleted

## 2021-08-31 NOTE — Telephone Encounter (Signed)
Post ED Visit - Positive Culture Follow-up  Culture report reviewed by antimicrobial stewardship pharmacist: [place] Team []  Elenor Quinones, Pharm.D. []  Heide Guile, Pharm.D., BCPS AQ-ID []  Parks Neptune, Pharm.D., BCPS []  Alycia Rossetti, Pharm.D., BCPS []  [place], Pharm.D., BCPS, AAHIVP []  Legrand Como, Pharm.D., BCPS, AAHIVP []  Salome Arnt, PharmD, BCPS []  Johnnette Gourd, PharmD, BCPS []  Hughes Better, PharmD, BCPS []  Leeroy Cha, PharmD []  Laqueta Linden, PharmD, BCPS []  Albertina Parr, PharmD  [place] Team []  Leodis Sias, PharmD []  Lindell Spar, PharmD []  Royetta Asal, PharmD []  Graylin Shiver, Rph []  Rema Fendt) Glennon Mac, PharmD []  Arlyn Dunning, PharmD []  Netta Cedars, PharmD []  Dia Sitter, PharmD []  Leone Haven, PharmD []  Gretta Arab, PharmD []  Theodis Shove, PharmD []  Peggyann Juba, PharmD []  Reuel Boom, PharmD   Positive urine culture Treated with Keflex, organism sensitive to the same and no further patient follow-up is required at this time.  Carlisle Cater, PA  Harlon Flor [place] 08/31/2021, 11:43 AM

## 2022-08-13 ENCOUNTER — Emergency Department (HOSPITAL_COMMUNITY): Payer: Self-pay

## 2022-08-13 ENCOUNTER — Emergency Department (HOSPITAL_COMMUNITY)
Admission: EM | Admit: 2022-08-13 | Discharge: 2022-08-13 | Disposition: A | Discharge status: Home / Self Care | Payer: Self-pay | Attending: Emergency Medicine | Admitting: Emergency Medicine

## 2022-08-13 ENCOUNTER — Other Ambulatory Visit: Payer: Self-pay

## 2022-08-13 DIAGNOSIS — R000 Tachycardia, unspecified: Secondary | ICD-10-CM | POA: Insufficient documentation

## 2022-08-13 DIAGNOSIS — T50901A Poisoning by unspecified drugs, medicaments and biological substances, accidental (unintentional), initial encounter: Secondary | ICD-10-CM | POA: Insufficient documentation

## 2022-08-13 LAB — COMPREHENSIVE METABOLIC PANEL
ALT: 11 U/L (ref 0–44)
AST: 16 U/L (ref 15–41)
Albumin: 4.2 g/dL (ref 3.5–5.0)
Alkaline Phosphatase: 36 U/L — ABNORMAL LOW (ref 38–126)
Anion gap: 11 (ref 5–15)
BUN: 18 mg/dL (ref 6–20)
CO2: 21 mmol/L — ABNORMAL LOW (ref 22–32)
Calcium: 8.7 mg/dL — ABNORMAL LOW (ref 8.9–10.3)
Chloride: 106 mmol/L (ref 98–111)
Creatinine, Ser: 0.63 mg/dL (ref 0.44–1.00)
GFR, Estimated: >60 mL/min (ref >60)
Glucose, Bld: 104 mg/dL — ABNORMAL HIGH (ref 70–99)
Potassium: 4.2 mmol/L (ref 3.5–5.1)
Sodium: 138 mmol/L (ref 135–145)
Total Bilirubin: 0.5 mg/dL (ref 0.3–1.2)
Total Protein: 7.5 g/dL (ref 6.5–8.1)

## 2022-08-13 LAB — CBC WITH DIFFERENTIAL/PLATELET
Abs Immature Granulocytes: 0.03 10*3/uL (ref 0.00–0.07)
Basophils Absolute: 0 10*3/uL (ref 0.0–0.1)
Basophils Relative: 0 %
Eosinophils Absolute: 0 10*3/uL (ref 0.0–0.5)
Eosinophils Relative: 0 %
HCT: 45.6 % (ref 36.0–46.0)
Hemoglobin: 14.6 g/dL (ref 12.0–15.0)
Immature Granulocytes: 0 %
Lymphocytes Relative: 17 %
Lymphs Abs: 1.9 10*3/uL (ref 0.7–4.0)
MCH: 28.3 pg (ref 26.0–34.0)
MCHC: 32 g/dL (ref 30.0–36.0)
MCV: 88.4 fL (ref 80.0–100.0)
Monocytes Absolute: 0.4 10*3/uL (ref 0.1–1.0)
Monocytes Relative: 4 %
Neutro Abs: 8.3 10*3/uL — ABNORMAL HIGH (ref 1.7–7.7)
Neutrophils Relative %: 79 %
Platelets: 220 10*3/uL (ref 150–400)
RBC: 5.16 MIL/uL — ABNORMAL HIGH (ref 3.87–5.11)
RDW: 13 % (ref 11.5–15.5)
WBC: 10.7 10*3/uL — ABNORMAL HIGH (ref 4.0–10.5)
nRBC: 0 % (ref 0.0–0.2)

## 2022-08-13 LAB — ETHANOL: Alcohol, Ethyl (B): <10 mg/dL (ref <10)

## 2022-08-13 LAB — SALICYLATE LEVEL: Salicylate Lvl: <7 mg/dL — ABNORMAL LOW (ref 7.0–30.0)

## 2022-08-13 LAB — AMMONIA: Ammonia: 45 umol/L — ABNORMAL HIGH (ref 9–35)

## 2022-08-13 LAB — ACETAMINOPHEN LEVEL: Acetaminophen (Tylenol), Serum: <10 ug/mL — ABNORMAL LOW (ref 10–30)

## 2022-08-13 LAB — CK: Total CK: 59 U/L (ref 38–234)

## 2022-08-13 LAB — HCG, QUANTITATIVE, PREGNANCY: hCG, Beta Chain, Quant, S: 1 m[IU]/mL (ref <5)

## 2022-08-13 NOTE — ED Triage Notes (Signed)
BIBA from home, boyfriend reports she left last night and come back home this am non verbal and half naked. Vomit x1.  Hx of substance abuse.  Pt is drowsy in triage, alert to verbal stimuli

## 2022-08-13 NOTE — ED Provider Notes (Addendum)
[facility] EMERGENCY DEPARTMENT AT [facility] Provider Note   CSN: [id number] Arrival date & time: 08/13/22  1331     History  Chief Complaint  Patient presents with   Drug Overdose    Evelyn Richardson is a 42 y.o. female.   Drug Overdose  Patient presents with potential substance use.  Reportedly brought in by EMS after family member called.  Reportedly left last night and came back speaking less and confused.  Reportedly half naked.  Patient will only speak to me at times.  Sometimes will answer questions other times will just lay on her side.     Home Medications Prior to Admission medications   Medication Sig Start Date End Date Taking? Authorizing Provider  cephALEXin (KEFLEX) 500 MG capsule Take 1 capsule (500 mg total) by mouth 3 (three) times daily. 08/28/21   [place], Barbette Hair, MD  ibuprofen (ADVIL) 600 MG tablet Take 1 tablet (600 mg total) by mouth every 6 (six) hours as needed. 08/28/21   Horton, Barbette Hair, MD      Allergies    Patient has no known allergies.    Review of Systems   Review of Systems  Physical Exam Updated Vital Signs BP 130/82 (BP Location: Left Arm)   Pulse 65   Temp 97.7 F (36.5 C) (Oral)   Resp 15   Wt 74.8 kg   SpO2 100%   BMI 23.33 kg/m  Physical Exam Vitals and nursing note reviewed.  Eyes:     Pupils: Pupils are equal, round, and reactive to light.  Cardiovascular:     Rate and Rhythm: Regular rhythm. Tachycardia present.  Pulmonary:     Breath sounds: No wheezing.  Abdominal:     Tenderness: There is no abdominal tenderness.  Musculoskeletal:     Cervical back: Neck supple.  Skin:    General: Skin is warm.  Neurological:     Mental Status: She is alert.     Comments: Sometimes awake and will answer questions.  Other times will not answer.  Moving her extremities.     ED Results / Procedures / Treatments   Labs (all labs ordered are listed, but only abnormal results are displayed) Labs Reviewed   AMMONIA - Abnormal; Notable for the following components:      Result Value   Ammonia 45 (*)    All other components within normal limits  CBC WITH DIFFERENTIAL/PLATELET - Abnormal; Notable for the following components:   WBC 10.7 (*)    RBC 5.16 (*)    Neutro Abs 8.3 (*)    All other components within normal limits  ACETAMINOPHEN LEVEL - Abnormal; Notable for the following components:   Acetaminophen (Tylenol), Serum <10 (*)    All other components within normal limits  SALICYLATE LEVEL - Abnormal; Notable for the following components:   Salicylate Lvl <0.1 (*)    All other components within normal limits  COMPREHENSIVE METABOLIC PANEL - Abnormal; Notable for the following components:   CO2 21 (*)    Glucose, Bld 104 (*)    Calcium 8.7 (*)    Alkaline Phosphatase 36 (*)    All other components within normal limits  ETHANOL  CK  HCG, QUANTITATIVE, PREGNANCY  RAPID URINE DRUG SCREEN, HOSP PERFORMED  URINALYSIS, ROUTINE W REFLEX MICROSCOPIC    EKG None  Radiology CT Head Wo Contrast  Result Date: 08/13/2022 CLINICAL DATA:  Mental status change EXAM: CT HEAD WITHOUT CONTRAST TECHNIQUE: Contiguous axial images were obtained from  the base of the skull through the vertex without intravenous contrast. RADIATION DOSE REDUCTION: This exam was performed according to the departmental dose-optimization program which includes automated exposure control, adjustment of the mA and/or kV according to patient size and/or use of iterative reconstruction technique. COMPARISON:  None Available. FINDINGS: Brain: No evidence of acute infarction, hemorrhage, hydrocephalus, extra-axial collection or mass lesion/mass effect. Vascular: No hyperdense vessel or unexpected calcification. Skull: Normal. Negative for fracture or focal lesion. Sinuses/Orbits: No acute finding. Other: None. IMPRESSION: No acute intracranial abnormality. Electronically Signed   By: Ronney Asters M.D.   On: 08/13/2022 16:23     Procedures Procedures    Medications Ordered in ED Medications - No data to display  ED Course/ Medical Decision Making/ A&P                             Medical Decision Making  Patient with mental status change.  Reportedly had potential drug use.  Differential diagnosis is long.  Could be behavioral, could be infectious or substance use related.  Will get basic blood work.  Head CT reviewed and reassuring. Patient now more awake.  Lab work overall reassuring but does have mild elevated ammonia.  States she did THC orange and that is what got her so altered.  States she feels much better.  Now eating.  Eager to leave and I think she is stable for discharge.  Outpatient follow-up as needed. Patient has a friend with her that will be staying with her also.         Final Clinical Impression(s) / ED Diagnoses Final diagnoses:  Accidental overdose, initial encounter    Rx / DC Orders ED Discharge Orders     None         Davonna Belling, MD 08/13/22 2234    Davonna Belling, MD 08/13/22 2234

## 2022-08-13 NOTE — ED Provider Triage Note (Signed)
Emergency Medicine Provider Triage Evaluation Note  Evelyn Richardson , a 42 y.o. female  was evaluated in triage.  Pt complains of possible substance use. EMS not present during interview. Patient not cooperative with exam so history is limited. Per nursing note, the patient left last night and came back non verbal and half naked.   Review of Systems  Positive:  Negative:   Physical Exam  BP (!) 143/95 (BP Location: Right Arm)   Pulse (!) 113   Temp 99.1 F (37.3 C) (Oral)   Resp 18   Wt 74.8 kg   SpO2 100%   BMI 23.33 kg/m  Gen:   Drowsy but awakes to verbal stimuli, she maintains eye contact, but does not speak other than yawning.  Resp:  Normal effort  MSK:   Moves extremities without difficulty  Other:  Extensive track marks present on the patients bilateral upper extremities.   Medical Decision Making  Medically screening exam initiated at 1:53 PM.  Appropriate orders placed.  Evelyn Richardson was informed that the remainder of the evaluation will be completed by another provider, this initial triage assessment does not replace that evaluation, and the importance of remaining in the ED until their evaluation is complete.  CT head and labs ordered.    Sherrell Puller, [place] 08/13/22 1355

## 2022-08-13 NOTE — ED Notes (Signed)
I attempted twice to collect labs and was unsuccessful 

## 2022-11-21 DEATH — deceased
